# Patient Record
Sex: Male | Born: 1945 | Race: Black or African American | Hispanic: No | Marital: Married | Smoking: Former smoker
Health system: Southern US, Community
[De-identification: ages and names within clinical notes are randomized; demographics above are authoritative.]

## PROBLEM LIST (undated history)

## (undated) DIAGNOSIS — H9190 Unspecified hearing loss, unspecified ear: Secondary | ICD-10-CM

## (undated) DIAGNOSIS — I10 Essential (primary) hypertension: Secondary | ICD-10-CM

## (undated) DIAGNOSIS — J189 Pneumonia, unspecified organism: Secondary | ICD-10-CM

## (undated) DIAGNOSIS — A419 Sepsis, unspecified organism: Secondary | ICD-10-CM

## (undated) DIAGNOSIS — I428 Other cardiomyopathies: Secondary | ICD-10-CM

## (undated) DIAGNOSIS — N184 Chronic kidney disease, stage 4 (severe): Secondary | ICD-10-CM

## (undated) DIAGNOSIS — D649 Anemia, unspecified: Secondary | ICD-10-CM

## (undated) DIAGNOSIS — IMO0001 Reserved for inherently not codable concepts without codable children: Secondary | ICD-10-CM

## (undated) DIAGNOSIS — J9600 Acute respiratory failure, unspecified whether with hypoxia or hypercapnia: Secondary | ICD-10-CM

## (undated) DIAGNOSIS — N529 Male erectile dysfunction, unspecified: Secondary | ICD-10-CM

## (undated) DIAGNOSIS — E785 Hyperlipidemia, unspecified: Secondary | ICD-10-CM

## (undated) DIAGNOSIS — E119 Type 2 diabetes mellitus without complications: Secondary | ICD-10-CM

## (undated) HISTORY — DX: Anemia, unspecified: D64.9

## (undated) HISTORY — DX: Type 2 diabetes mellitus without complications: E11.9

## (undated) HISTORY — PX: AV FISTULA PLACEMENT: SHX1204

## (undated) HISTORY — DX: Reserved for inherently not codable concepts without codable children: IMO0001

## (undated) HISTORY — PX: COLONOSCOPY W/ POLYPECTOMY: SHX1380

## (undated) HISTORY — DX: Male erectile dysfunction, unspecified: N52.9

## (undated) HISTORY — DX: Unspecified hearing loss, unspecified ear: H91.90

## (undated) HISTORY — DX: Hyperlipidemia, unspecified: E78.5

## (undated) HISTORY — DX: Other cardiomyopathies: I42.8

## (undated) HISTORY — DX: Chronic kidney disease, stage 4 (severe): N18.4

## (undated) HISTORY — DX: Essential (primary) hypertension: I10

---

## 2000-04-28 DIAGNOSIS — I428 Other cardiomyopathies: Secondary | ICD-10-CM

## 2000-04-28 HISTORY — DX: Other cardiomyopathies: I42.8

## 2002-04-01 ENCOUNTER — Ambulatory Visit (HOSPITAL_COMMUNITY): Admission: RE | Admit: 2002-04-01 | Discharge: 2002-04-01 | Payer: Self-pay | Admitting: Cardiology

## 2003-05-10 ENCOUNTER — Encounter: Payer: Self-pay | Admitting: Cardiology

## 2004-03-28 ENCOUNTER — Ambulatory Visit: Payer: Self-pay | Admitting: Cardiology

## 2004-05-06 ENCOUNTER — Ambulatory Visit: Payer: Self-pay | Admitting: Cardiology

## 2005-04-10 ENCOUNTER — Ambulatory Visit: Payer: Self-pay | Admitting: Cardiology

## 2005-06-30 ENCOUNTER — Ambulatory Visit: Payer: Self-pay | Admitting: Cardiology

## 2005-07-28 ENCOUNTER — Ambulatory Visit: Payer: Self-pay | Admitting: Cardiology

## 2005-08-28 ENCOUNTER — Ambulatory Visit: Payer: Self-pay | Admitting: *Deleted

## 2005-09-17 ENCOUNTER — Ambulatory Visit: Payer: Self-pay | Admitting: *Deleted

## 2006-02-05 ENCOUNTER — Ambulatory Visit: Payer: Self-pay | Admitting: Cardiology

## 2006-05-01 ENCOUNTER — Ambulatory Visit: Payer: Self-pay | Admitting: Cardiology

## 2006-08-05 ENCOUNTER — Ambulatory Visit: Payer: Self-pay | Admitting: Cardiology

## 2007-05-07 ENCOUNTER — Ambulatory Visit (HOSPITAL_COMMUNITY): Admission: RE | Admit: 2007-05-07 | Discharge: 2007-05-07 | Payer: Self-pay | Admitting: Pulmonary Disease

## 2008-03-15 ENCOUNTER — Ambulatory Visit: Payer: Self-pay | Admitting: Cardiology

## 2008-04-07 ENCOUNTER — Ambulatory Visit: Payer: Self-pay | Admitting: Cardiology

## 2008-04-24 ENCOUNTER — Ambulatory Visit: Payer: Self-pay | Admitting: Cardiology

## 2008-05-08 ENCOUNTER — Ambulatory Visit (HOSPITAL_COMMUNITY): Admission: RE | Admit: 2008-05-08 | Discharge: 2008-05-08 | Payer: Self-pay | Admitting: Pulmonary Disease

## 2008-05-10 ENCOUNTER — Ambulatory Visit: Payer: Self-pay | Admitting: Cardiology

## 2008-05-17 ENCOUNTER — Encounter (HOSPITAL_COMMUNITY): Admission: RE | Admit: 2008-05-17 | Discharge: 2008-06-16 | Payer: Self-pay | Admitting: Pulmonary Disease

## 2008-10-02 ENCOUNTER — Telehealth (INDEPENDENT_AMBULATORY_CARE_PROVIDER_SITE_OTHER): Payer: Self-pay

## 2009-01-04 ENCOUNTER — Encounter (INDEPENDENT_AMBULATORY_CARE_PROVIDER_SITE_OTHER): Payer: Self-pay | Admitting: *Deleted

## 2009-01-04 ENCOUNTER — Encounter: Payer: Self-pay | Admitting: Cardiology

## 2009-01-04 LAB — CONVERTED CEMR LAB
Albumin: 4 g/dL
Alkaline Phosphatase: 55 units/L
Bilirubin, Direct: 0.1 mg/dL
Cholesterol: 266 mg/dL
HDL: 34 mg/dL
Total Protein: 7 g/dL
Triglycerides: 250 mg/dL

## 2009-01-08 LAB — CONVERTED CEMR LAB
ALT: 15 units/L (ref 0–53)
Bilirubin, Direct: 0.1 mg/dL (ref 0.0–0.3)
Cholesterol: 266 mg/dL — ABNORMAL HIGH (ref 0–200)
Indirect Bilirubin: 0.4 mg/dL (ref 0.0–0.9)
LDL Cholesterol: 182 mg/dL — ABNORMAL HIGH (ref 0–99)
Total CHOL/HDL Ratio: 7.8
VLDL: 50 mg/dL — ABNORMAL HIGH (ref 0–40)

## 2009-02-16 ENCOUNTER — Ambulatory Visit: Payer: Self-pay | Admitting: Cardiology

## 2009-02-16 ENCOUNTER — Encounter (INDEPENDENT_AMBULATORY_CARE_PROVIDER_SITE_OTHER): Payer: Self-pay | Admitting: *Deleted

## 2009-02-28 ENCOUNTER — Ambulatory Visit (HOSPITAL_COMMUNITY): Admission: RE | Admit: 2009-02-28 | Discharge: 2009-02-28 | Payer: Self-pay | Admitting: Pulmonary Disease

## 2009-03-27 ENCOUNTER — Encounter: Payer: Self-pay | Admitting: Orthopedic Surgery

## 2009-05-04 ENCOUNTER — Telehealth (INDEPENDENT_AMBULATORY_CARE_PROVIDER_SITE_OTHER): Payer: Self-pay | Admitting: *Deleted

## 2010-03-05 ENCOUNTER — Ambulatory Visit: Payer: Self-pay | Admitting: Cardiology

## 2010-05-28 NOTE — Assessment & Plan Note (Addendum)
Summary: 1 YR F/U PER CHECKOUT ON 02/16/09/TG  Medications Added LOPRESSOR 50 MG TABS (METOPROLOL TARTRATE) take 2 tab two times a day CRESTOR 20 MG TABS (ROSUVASTATIN CALCIUM) Take 1 tablet by mouth once a day FUROSEMIDE 40 MG TABS (FUROSEMIDE) take 1 tab two times a day ASPIR-LOW 81 MG TBEC (ASPIRIN) take 1 tab daily DIOVAN 320 MG TABS (VALSARTAN) take 1 tab daily      Allergies Added: NKDA  Visit Type:  Follow-up Referring Provider:  [facility] Primary Provider:  Dr. Shaune Pollack   History of Present Illness: [name] returns to the office for continued assessment and treatment of a remote history of cardiomyopathy, but without clinical manifestations for at least the last 8 years.  He remains active with generally good control of diabetes and hypertension.  Blood pressures measured at home range from 130 to 140 systolic with normal diastolics.  I started clonidine the last time he was in the office, but that prescription was lost somewhere along the way.  The most recent lipid profile I have available for him was from 2010 and was not at all good.  He has moderate chronic kidney disease.  He describes been referred to a program at the [facility], possibly a clinical study, but apparently did not qualify for the program.  Current Medications (verified): 1)  Glipizide 5 Mg Tabs (Glipizide) .... Take 2 Tabs Two Times A Day 2)  Metformin Hcl 500 Mg Tabs (Metformin Hcl) .... Take 1 Tab Two Times A Day 3)  Lopressor 50 Mg Tabs (Metoprolol Tartrate) .... Take 2 Tab Two Times A Day 4)  Amlodipine Besylate 10 Mg Tabs (Amlodipine Besylate) .... Take 1 Tab Daily 5)  Crestor 20 Mg Tabs (Rosuvastatin Calcium) .... Take 1 Tablet By Mouth Once A Day 6)  Furosemide 40 Mg Tabs (Furosemide) .... Take 1 Tab Two Times A Day 7)  Aspir-Low 81 Mg Tbec (Aspirin) .... Take 1 Tab Daily 8)  Diovan 320 Mg Tabs (Valsartan) .... 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  Past History:  PMH, FH, and Social History reviewed and updated.  Past Surgical History: Colonoscopy-?year  Family History: Father died as the result of trauma Mother is alive without notable chronic diseases Siblings: One sister deceased as a result of a gunshot wound; 3 sisters and one brother are living  Social History: Jehovah's Witness Disabled Married with 2 sons, one deceased  Review of Systems       See history of present illness.  Vital Signs:  Patient profile:   65 year old male Weight:      226 pounds BMI:     36.61 Pulse rate:   67 / minute BP sitting:   144 / 74  (right arm)  Vitals Entered By: Dreama Saa, CNA (March 05, 2010 2:05 PM)  Physical Exam  General:  Overweight; well developed; no acute distress;    Weight-226, 3 pounds less than last year Neck-No JVD; no carotid bruits: Lungs-No tachypnea, no rales; no rhonchi; no wheezes: Cardiovascular-normal PMI; normal S1 and S2; grade 1-2/6systolic ejection murmur heard widely across the precordium Abdomen-BS normal; soft and non-tender without masses or organomegaly:  Musculoskeletal-No deformities, no cyanosis or clubbing: Neurologic-Normal cranial nerves; symmetric strength and tone:  Skin-Warm, multiple small keloids over the neck and shoulders Extremities-1-2+ distal pulses; no edema:     Impression & Recommendations:  Problem # 1:  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  Certainly, he has no symptoms of congestive heart failure and is doing extremely well with current therapy, which is primarily directed at hypertension.  Problem # 2:  HYPERTENSION (ICD-401.1) Blood pressure control is very good, but not quite optimal.  Unfortunately,  his current medications are at maximal dose.  For a few millimeters of better blood pressure control, I am not inclined to add yet another drug.  Problem # 3:  HYPERLIPIDEMIA (ICD-272.4) Lipid control has never been adequate.  His dose of rosuvastatin will be doubled, and a profile repeated in one month.  CHOL: 266 (01/04/2009)   LDL: 182 (01/04/2009)   HDL: 34 (01/04/2009)   TG: 250 (01/04/2009)  Problem # 4:  DIABETES MELLITUS, TYPE II (ICD-250.00) Patient would benefit from weight loss in terms of control of diabetes and hypertension.  We will refer him to a dietitian for appropriate teaching if this has not previously been provided.  He has been walking up to 3 miles per day and is encouraged to continue to do this.  I will see him again in one year.  Patient Instructions: 1)  Your physician recommends that you schedule a follow-up appointment in: 1 year 2)  Your physician recommends that you return for lab work in: 1 month 3)  Your physician has recommended you make the following change in your medication:  increase crestor to 20mg   daily 4)  Your physician encouraged you to lose weight for better health. 5)  please call Victorino Dike  [phone] Prescriptions: CRESTOR 20 MG TABS (ROSUVASTATIN CALCIUM) Take 1 tablet by mouth once a day  #30 x 3   Entered by:   Teressa Lower RN   Authorized by:   Kathlen Brunswick, MD, [facility]   Signed by:   Teressa Lower RN on 03/05/2010   Method used:   Electronically to        [facility] (retail)       [address]       [place], [place]  [address]       Ph: [phone] or [phone]       Fax: [phone]   RxID:   [id number]   Appended Document: 1 YR F/U PER CHECKOUT ON 02/16/09/TG    Clinical Lists Changes  Medications: Rx of CRESTOR 20 MG TABS (ROSUVASTATIN CALCIUM) Take 1 tablet by mouth once a day;  #90 x 3;  Signed;  Entered by: Teressa Lower RN;  Authorized by: Kathlen Brunswick, MD, [facility];  Method  used: Print then Give to Patient    Prescriptions: CRESTOR 20 MG TABS (ROSUVASTATIN CALCIUM) Take 1 tablet by mouth once a day  #90 x 3   Entered by:   Teressa Lower RN   Authorized by:   Kathlen Brunswick, MD, [facility]   Signed by:   Teressa Lower RN on 05/08/2010   Method used:   Print then Give to Patient   RxID:   [id number]

## 2010-05-28 NOTE — Progress Notes (Signed)
Summary: pt out of fluid pill   Phone Note Call from Patient Call back at Home Phone [phone]   Caller: pt Reason for Call: Refill Medication, Privacy/Consent Authorization Summary of Call: [facility] has told pt that they have called [place] twice and requested fluid pill. Patient is out of med and needs to know if he needs to take 1/2 pill or a whole pill. Plus pt was seen at the [facility] and was told that something is wrong with his kidneys he wants [place] to call them and find out what needs done.  pt walk in today needs to know if he should be on pills or not nothing was called in for him at pharmamcy. Initial call taken by: Faythe Ghee,  May 04, 2009 11:34 AM    Prescriptions: CHLORTHALIDONE 25 MG TABS (CHLORTHALIDONE) take 1 tab daily  #30 x 6   Entered by:   Teressa Lower RN   Authorized by:   Kathlen Brunswick, MD, [facility]   Signed by:   Teressa Lower RN on 05/07/2009   Method used:   Electronically to        [facility] (retail)       [address]       [place], [place]  [address]       Ph: [phone] or [phone]       Fax: [phone]   RxID:   [id number]

## 2010-09-10 NOTE — Assessment & Plan Note (Signed)
[facility]                       [place] CARDIOLOGY OFFICE NOTE   NAME:WHITERanvir, Renovato                         MRN:          [id number]  DATE:03/15/2008                            DOB:          [birth year]    CARDIOLOGIST:  Gerrit Friends. Dietrich Pates, MD, [facility].   PRIMARY CARE PHYSICIAN:  Edward L. Juanetta Gosling, MD.   REASON FOR VISIT:  Follow up on cardiomyopathy.   HISTORY OF PRESENT ILLNESS:  Mr. Sjogren is a 65 year old male patient  with a history of nonischemic cardiomyopathy with a previous EF of 15-  20%, improved to 50% by echocardiogram in 2003 who presents for followup  today.  We have actually not seen him in the office since April 2008.  He is overall doing well without any complaints of chest pain or  significant shortness of breath.  He describes NYHA class II to class  IIB symptoms.  He sleeps on a large pillow and has done so for many  years without change.  He denies any paroxysmal nocturnal dyspnea.  He  notes some pedal edema that is chronic without change.  Denies any  syncope.  He is getting ready to go to [place] for vacation and leaves  early next week.   MEDICATIONS:  Glipizide 10 mg b.i.d., Lisinopril 40 mg daily, Metformin  500 mg b.i.d., Valsartan 320 mg daily, Amlodipine 10 mg daily,  Metoprolol 100 mg b.i.d., Simvastatin 80 mg nightly, Chlorthalidone 25  mg half tablet daily, Tylenol p.r.n., Advil p.r.n.   PHYSICAL EXAMINATION:  GENERAL:  He is a well-nourished and well-  developed male, in no distress.  VITAL SIGNS:  Blood pressure is 150/90, pulse 76, weight 230 pounds,  this is down 2 pounds since his last visit in April 2008.  HEENT:  Normal.  NECK:  Without JVD.  CARDIAC:  Normal S1 and S2.  Regular rate and rhythm.  LUNGS:  Clear to auscultation bilaterally.  ABDOMEN:  Soft, nontender.  EXTREMITIES:  With trace edema bilaterally.  NEUROLOGIC:  He is alert and oriented x3.  Cranial nerves II through XII  grossly intact.  VASCULAR:  Dorsalis pedis and posterior tibial pulses are 2+  bilaterally.   ASSESSMENT AND PLAN:  1. Chronic systolic congestive heart failure secondary to nonischemic      cardiomyopathy with a previous ejection fraction of 15-20%,      improved to 50% by last echocardiogram done in December 2003.  The      patient is stable from a volume standpoint.  He is on an excellent      medical regimen.  He is New York Heart Association class II to      class IIB.  No further testing is warranted at this time.  Of note,      he had normal coronaries by catheterization in 1997.  2. Hypertension, this is uncontrolled.  His goal is less than 130/80      with his diabetes mellitus.  However, since he is getting ready to      go on vacation for almost 2 weeks, we will hold off many medication  adjustments.  I have asked him to return to the office when he gets      back from his vacation for a blood pressure check with one of our      nurses.  If his blood pressure is still uncontrolled, we can      consider adjusting his chlorthalidone or his metoprolol.  3. Dyslipidemia.  We will set the patient up for CMET and lipid panel      when he returns from his vacation.  4. Chronic renal insufficiency.  As noted above, we will check labs to      follow up on his renal function as well as his lipids.   DISPOSITION:  The patient will be brought back and follow up with myself  or Dr. Dietrich Pates in the next 6 months or sooner p.r.n.      Tereso Newcomer, PA-C  Electronically Signed      Jonelle Sidle, MD  Electronically Signed   SW/MedQ  DD: 03/15/2008  DT: 03/16/2008  Job #: [id number]   cc:   Ramon Dredge L. Juanetta Gosling, M.D.

## 2010-09-13 NOTE — Letter (Signed)
August 05, 2006    Ramon Dredge L. Juanetta Gosling, M.D.  [address]  [place], [place] [address]   RE:  Roger Ashley, Roger Ashley  MRN:  [id number]  /  DOB:  April 08, 1946   Dear [name],   Roger Ashley returns to the office for continuing assessment and treatment  of a history of cardiomyopathy, hypertension and cardiovascular risk  factors.  Since the last visit, he has done generally well.  He  frequently walks outside without difficulty.  He would like to start  lifting light weights.  He does note some decrease in exercise tolerance  and rare episodes of exertional dyspnea, but generally feels well.  He  has monitored blood pressure at home with generally good results.  Systolics are typically in the 130s and sometimes in the 140s;  diastolics are usually around 70.   CURRENT MEDICATIONS INCLUDE:  1. Glipizide 10 mg b.i.d.  2. Lisinopril 40 mg daily.  3. Metformin 500 mg b.i.d.  4. Valsartan 320 mg daily.  5. Amlodipine 10 mg daily.  6. Metoprolol 100 mg b.i.d.  7. Simvastatin 80 mg daily.  8. Chlorthalidone 12.5 mg daily.   ON EXAM:  A pleasant gentleman, in no acute distress.  The weight is  232, one pound more than in January.  Blood pressure 130/75, heart rate  70 and regular, respirations 16.  NECK:  No jugular venous distention, normal carotid upstrokes, without  bruits.  LUNGS:  Clear.  CARDIAC:  Normal first and second heart sounds; minimal systolic murmur.  ABDOMEN:  Soft and nontender; no bruits, no organomegaly.  EXTREMITIES:  Trace edema.   Chemistry profile from February showed normal electrolytes, a glucose of  154, a BUN of 26, and creatinine of 1.7, which was increased from 1.5  one year earlier.  Hemoglobin A1c was 7.1.   IMPRESSION:  Roger Ashley is doing generally well, with good control of  hypertension, hyperlipidemia and diabetes.  You may wish to push the  doses of his diabetic medications slightly to achieve a hemoglobin A1c  closer to 6.  He does have a minor degree of renal  insufficiency, which  will need to be followed.  Treatment with an ARB is appropriate.  Vaccinations are up to date.  I will plan to see this nice gentleman  again in eight months.  A chemistry profile will be reassessed in four  months.    Sincerely,      Gerrit Friends. Dietrich Pates, MD, [facility]  Electronically Signed    RMR/MedQ  DD: 08/05/2006  DT: 08/05/2006  Job #: [id number]

## 2010-09-13 NOTE — Procedures (Signed)
   NAMEJAYESH, MARBACH NO.:  [id number]   MEDICAL RECORD NO.:  [id number]                   PATIENT TYPE:  OUT   LOCATION:  RAD                                  FACILITY:  [facility]   PHYSICIAN:  [place] Bing, M.D. [facility]           DATE OF BIRTH:  30-Jan-1946   DATE OF PROCEDURE:  04/01/2002                  AGE:  65  DATE OF DISCHARGE:                              SEX:  M                                CARDIAC ULTRASOUND   REFERRING PHYSICIANS:  Edward L. Juanetta Gosling, M.D. and [place] Bing, M.D.   CLINICAL INFORMATION:  A 65 year old gentleman with congestive heart  failure, hypertension, and diabetes.   M-MODE:  AORTA:  2.9 (<4.0)  LEFT ATRIUM:  4.6 (<4.0)  SEPTUM:  1.2 (0.7-1.1)  POSTERIOR WALL:  1.2  (0.7-1.1)  LV-DIASTOLE:  5.2  (<5.7)  LV-SYSTOLE:  3.8  (<4.0)   FINDINGS/IMPRESSION:  1. A technically suboptimal but adequate echocardiographic study.  2. Very mild left atrial enlargement; normal right atrium and right     ventricle.  3. Mild aortic valvular sclerosis.  4. Normal tricuspid and pulmonary valves.  5. Normal mitral valve with mild annular calcification and very mild     regurgitation.  6. Normal internal dimension of the left ventricle; very mild LVH.  Normal     regional and global LV systolic function except for a very small area at     the base of the inferior wall that appears to be hypokinetic.  7. IVC is not well imaged--probably normal in diameter.  8. Comparison with a prior study of 10/97: Interval decrease in the left     atrial and left ventricular size.  There has been a marked improvement in     LV systolic function.                                               [place] Bing, M.D. [facility]    RR/MEDQ  D:  04/01/2002  T:  04/01/2002  Job:  [id number]

## 2010-09-13 NOTE — Letter (Signed)
May 01, 2006    Edward L. Juanetta Gosling, M.D.  [address]  [place], [place] [address]   RE:  [name], [name]  MRN:  [id number]  /  DOB:  20-Dec-1945   Dear [name],   Roger Ashley returns to the office for continued assessment and treatment  of hypertension.  He was followed closely by the nurses for a few  months, but has not seen [place] since May.  He has been followed at the  [facility] and has been receiving his  medications from them.  He has also monitored blood pressure with  generally good results in recent months.  Systolics are typically around  140-150 and diastolics are all below 85.   CURRENT MEDICATIONS INCLUDE:  1. Glipizide 10 mg b.i.d.  2. Lisinopril 40 mg daily.  3. Metformin 500 mg b.i.d.  4. Metoprolol 100 mg b.i.d.  5. Simvastatin 80 mg daily.  6. Amlodipine, which was substituted for felodipine, 10 mg daily.  7. Valsartan 320 mg daily.   Once again, he has lost track of his diuretic.   PHYSICAL EXAMINATION:  A very pleasant gentleman, in no acute distress.  Weight is 231, three pounds more than in May.  Blood pressure 150/60,  heart rate 75 and regular, respirations 16.  NECK:  No jugular venous distention; normal carotid upstrokes, without  bruits.  LUNGS:  Clear.  CARDIAC:  Normal first and second heart sounds.  Fourth heart sound  present.  ABDOMEN:  Soft and nontender; no organomegaly, no masses.  EXTREMITIES:  One-half-plus ankle edema.   IMPRESSION:  Roger Ashley control of hypertension is improved.  His  medical regimen is pretty good, but he would benefit by lower blood  pressure and a low-dose diuretic.  We will add chlorthalidone 12.5 mg  daily, and provided him with a 90-day supply.  A chemistry profile and  hemoglobin A1c level will be obtained in one month.  I will see this  nice gentleman again in three months.    Sincerely,      Gerrit Friends. Dietrich Pates, MD, [facility]  Electronically Signed    RMR/MedQ  DD: 05/01/2006  DT:  05/01/2006  Job #: [id number]

## 2010-10-29 ENCOUNTER — Other Ambulatory Visit: Payer: Self-pay

## 2010-10-29 MED ORDER — VALSARTAN 320 MG PO TABS: 320.0000 mg | ORAL_TABLET | Freq: Every day | ORAL | Status: DC

## 2011-04-01 ENCOUNTER — Encounter: Payer: Self-pay | Admitting: Adult Health

## 2011-04-01 ENCOUNTER — Ambulatory Visit (INDEPENDENT_AMBULATORY_CARE_PROVIDER_SITE_OTHER): Payer: Medicare Other | Admitting: Adult Health

## 2011-04-01 DIAGNOSIS — I10 Essential (primary) hypertension: Secondary | ICD-10-CM

## 2011-04-01 DIAGNOSIS — I429 Cardiomyopathy, unspecified: Secondary | ICD-10-CM

## 2011-04-01 MED ORDER — VALSARTAN-HYDROCHLOROTHIAZIDE 320-12.5 MG PO TABS: 1.0000 | ORAL_TABLET | Freq: Every day | ORAL | Status: DC

## 2011-04-01 NOTE — Assessment & Plan Note (Signed)
Blood pressure is not adequately controlled in patient with diabetes. He is also admitting to eating salty foods over that last week,during holidays. I have retaken his BP in the exam room and it is the same as triage recording. I will add HCTZ to his diovan RX.  Check a BMET in one week.  See him in one month.  He is given low sodium diet and advised to watch his intake closely. He is to increase his exercise regimen to become more active and lose wt.

## 2011-04-01 NOTE — Progress Notes (Signed)
  HPI:  Mr. Taillon is a pleasant 65 y/o patient of Dr. Dietrich Pates we are following for ongoing assessment and treatment for  Hypertension, cardiomyopathy without symptoms, with history of diabetes.  He is usually seen by the [facility] office in [place] for PCP issues. He was recently taken off of Crestor secondary to myalgia's per [facility] physician, with follow-up in Feb for reassessment of lipids and symptoms. He comes today without complaint of chest pain, DOE, or myalgia pain. He is upset about 8 lb wt gain. He has not been as active since the cold weather has started, but normally would walk about 3 miles a day without difficulties.   No Known Allergies  Current Outpatient Prescriptions  Medication Sig Dispense Refill  . amLODipine (NORVASC) 10 MG tablet Take 10 mg by mouth daily.        Marland Kitchen aspirin 81 MG tablet Take 81 mg by mouth daily.        . furosemide (LASIX) 40 MG tablet Take 40 mg by mouth 2 (two) times daily.        Marland Kitchen glipiZIDE (GLUCOTROL) 5 MG tablet Take 10 mg by mouth 2 (two) times daily before a meal.        . metFORMIN (GLUCOPHAGE) 500 MG tablet Take 500 mg by mouth 2 (two) times daily.        . metoprolol (LOPRESSOR) 50 MG tablet Take 100 mg by mouth 2 (two) times daily.        . rosuvastatin (CRESTOR) 20 MG tablet Take 20 mg by mouth daily.        . valsartan-hydrochlorothiazide (DIOVAN-HCT) 320-12.5 MG per tablet Take 1 tablet by mouth daily.  90 tablet  3    Past Medical History  Diagnosis Date  . Chronic systolic congestive heart failure   . Non-ischemic cardiomyopathy     EF 15-20%; EF recovred to 50% by Echo in 03/2002; [facility]GA at [facility] EF 53%  . Diabetes mellitus type II   . Hyperlipidemia   . Hypertension   . Chronic renal insufficiency   . Erectile dysfunction   . Hearing impairment     Past Surgical History  Procedure Date  . Colonoscopy     WUJ:WJXBJY of systems complete and found to be negative unless listed above  PHYSICAL EXAM BP 153/77  Pulse 68  Ht 5\' 6"   (1.676 m)  Wt 233 lb (105.688 kg)  BMI 37.61 kg/m2  General: Well developed, well nourished, in no acute distress Head: Eyes PERRLA, No xanthomas.   Normal cephalic and atramatic  Lungs: Clear bilaterally to auscultation and percussion. Heart: HRRR S1 S2, without MRG.  Pulses are 2+ & equal.            No carotid bruit. No JVD.  No abdominal bruits. No femoral bruits. Abdomen: Bowel sounds are positive, abdomen soft, obese, and non-tender without masses or                  Hernia's noted. Msk:  Back normal, normal gait. Normal strength and tone for age. Extremities: No clubbing, cyanosis +1 edema.  DP +1 Neuro: Alert and oriented X 3. Psych:  Good affect, responds appropriately    ASSESSMENT AND PLAN

## 2011-04-01 NOTE — Patient Instructions (Signed)
Your physician recommends that you schedule a follow-up appointment in: 6 months   Your physician recommends that you return for lab work in: 1 week   Your physician has recommended you make the following change in your medication:  STOP Diovan START Diovan/HCTZ 320/12.5 mg daily  Low Sodium Diet (information included)

## 2011-04-08 LAB — BASIC METABOLIC PANEL
BUN: 29 mg/dL — ABNORMAL HIGH (ref 6–23)
CO2: 25 mEq/L (ref 19–32)
Chloride: 107 mEq/L (ref 96–112)
Potassium: 4.4 mEq/L (ref 3.5–5.3)

## 2011-09-25 ENCOUNTER — Other Ambulatory Visit: Payer: Self-pay | Admitting: *Deleted

## 2011-09-25 DIAGNOSIS — N289 Disorder of kidney and ureter, unspecified: Secondary | ICD-10-CM

## 2011-09-25 DIAGNOSIS — E782 Mixed hyperlipidemia: Secondary | ICD-10-CM

## 2011-09-26 LAB — BASIC METABOLIC PANEL
BUN: 30 mg/dL — ABNORMAL HIGH (ref 6–23)
Chloride: 107 mEq/L (ref 96–112)
Creat: 3.48 mg/dL — ABNORMAL HIGH (ref 0.50–1.35)
Glucose, Bld: 99 mg/dL (ref 70–99)
Potassium: 4.2 mEq/L (ref 3.5–5.3)

## 2011-09-26 LAB — LIPID PANEL
LDL Cholesterol: 126 mg/dL — ABNORMAL HIGH (ref 0–99)
VLDL: 21 mg/dL (ref 0–40)

## 2011-09-28 ENCOUNTER — Encounter: Payer: Self-pay | Admitting: Cardiology

## 2011-09-28 DIAGNOSIS — E119 Type 2 diabetes mellitus without complications: Secondary | ICD-10-CM | POA: Insufficient documentation

## 2011-09-28 DIAGNOSIS — I10 Essential (primary) hypertension: Secondary | ICD-10-CM | POA: Insufficient documentation

## 2011-09-28 DIAGNOSIS — I428 Other cardiomyopathies: Secondary | ICD-10-CM | POA: Insufficient documentation

## 2011-09-28 DIAGNOSIS — E663 Overweight: Secondary | ICD-10-CM | POA: Insufficient documentation

## 2011-09-28 DIAGNOSIS — N189 Chronic kidney disease, unspecified: Secondary | ICD-10-CM | POA: Insufficient documentation

## 2011-09-28 DIAGNOSIS — E785 Hyperlipidemia, unspecified: Secondary | ICD-10-CM | POA: Insufficient documentation

## 2011-09-29 ENCOUNTER — Encounter: Payer: Self-pay | Admitting: Cardiology

## 2011-09-29 ENCOUNTER — Encounter: Payer: Self-pay | Admitting: *Deleted

## 2011-09-29 ENCOUNTER — Ambulatory Visit (INDEPENDENT_AMBULATORY_CARE_PROVIDER_SITE_OTHER): Payer: Medicare Other | Admitting: Cardiology

## 2011-09-29 VITALS — BP 160/82 | HR 80 | Ht 66.0 in | Wt 229.0 lb

## 2011-09-29 DIAGNOSIS — I10 Essential (primary) hypertension: Secondary | ICD-10-CM

## 2011-09-29 DIAGNOSIS — E119 Type 2 diabetes mellitus without complications: Secondary | ICD-10-CM

## 2011-09-29 DIAGNOSIS — E785 Hyperlipidemia, unspecified: Secondary | ICD-10-CM

## 2011-09-29 DIAGNOSIS — N189 Chronic kidney disease, unspecified: Secondary | ICD-10-CM

## 2011-09-29 MED ORDER — CLONIDINE HCL 0.1 MG PO TABS: 0.1000 mg | ORAL_TABLET | Freq: Two times a day (BID) | ORAL | Status: DC

## 2011-09-29 MED ORDER — VALSARTAN 160 MG PO TABS: 160.0000 mg | ORAL_TABLET | Freq: Every day | ORAL | Status: DC

## 2011-09-29 MED ORDER — SILDENAFIL CITRATE 50 MG PO TABS: 50.0000 mg | ORAL_TABLET | Freq: Every day | ORAL | Status: DC | PRN

## 2011-09-29 NOTE — Patient Instructions (Addendum)
Your physician recommends that you schedule a follow-up appointment in: 1 -  8 months 2 -  1 month blood pressure check  Your physician has recommended you make the following change in your medication:  1 - Decrease Diovan 160 mg daily - May [facility] 2 - START Clonidine 0.1 mg twice a day  Your physician has requested that you regularly monitor and record your blood pressure readings at home. Please use the same machine at the same time of day to check your readings and record them to bring to your follow-up visit.  Your physician recommends that you return for lab work in: 1 month

## 2011-09-29 NOTE — Progress Notes (Signed)
Patient ID: Roger Ashley, male   DOB: May 29, 1945, 66 y.o.   MRN: [id number]  HPI: Scheduled return visit for this very nice gentleman with long-standing hypertension and chronic kidney disease.  Since his last visit, he has had progression of renal insufficiency prompting modification of his medical regime.  Thiazide diuretic was discontinued and furosemide started at moderate dose.  Although Metformin was discontinued, the patient reports good control of diabetes.  He is unaware of any recent hemoglobin A1c values.  Prior to Admission medications   Medication Sig Start Date End Date Taking? Authorizing Provider  amLODipine (NORVASC) 10 MG tablet Take 10 mg by mouth daily.     Yes Historical Provider, MD  aspirin 81 MG tablet Take 81 mg by mouth daily.     Yes Historical Provider, MD  furosemide (LASIX) 40 MG tablet Take 40 mg by mouth 2 (two) times daily.     Yes Historical Provider, MD  glipiZIDE (GLUCOTROL) 5 MG tablet Take 10 mg by mouth 2 (two) times daily before a meal.     Yes Historical Provider, MD  metoprolol (LOPRESSOR) 50 MG tablet Take 100 mg by mouth 2 (two) times daily.     Yes Historical Provider, MD  rosuvastatin (CRESTOR) 40 MG tablet Take 40 mg by mouth daily.   Yes Historical Provider, MD  valsartan (DIOVAN) 160 MG tablet Take 1 tablet (160 mg total) by mouth daily. 09/29/11  Yes Roger Brunswick, MD  cloNIDine (CATAPRES) 0.1 MG tablet Take 1 tablet (0.1 mg total) by mouth 2 (two) times daily. 09/29/11 09/28/12  Roger Brunswick, MD  sildenafil (VIAGRA) 50 MG tablet Take 1 tablet (50 mg total) by mouth daily as needed for erectile dysfunction. 09/29/11 10/29/11  Roger Brunswick, MD   No Known Allergies    Past medical history, social history, and family history reviewed and updated.  ROS: Denies dyspnea, orthopnea, PND, pedal edema, chest discomfort, palpitations, lightheadedness or syncope.  Appetite in exercise tolerance are good.  All other systems reviewed and are  negative.  PHYSICAL EXAM: BP 160/82  Pulse 80  Ht 5\' 6"  (1.676 m)  Wt 103.874 kg (229 lb)  BMI 36.96 kg/m2  General-Well developed; no acute distress Body habitus-Moderately overweight Neck-No JVD; no carotid bruits Lungs-Few rales; decreased breath sounds; resonant to percussion Cardiovascular-normal PMI; normal S1 and slightly accentuated S2; regular rhythm Abdomen-normal bowel sounds; soft and non-tender without masses or organomegaly Musculoskeletal-No deformities, no cyanosis or clubbing Neurologic-Normal cranial nerves; symmetric strength and tone Skin-Warm, no significant lesions Extremities-distal pulses intact; no edema  ASSESSMENT AND PLAN:  Roger Bing, MD 09/29/2011 3:42 PM

## 2011-09-29 NOTE — Assessment & Plan Note (Addendum)
Renal dysfunction has been progressive with an increase in creatinine from 3.15 to 3.48 over the past 6 months.  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  Dose of valsartan will be decreased to determine if this has a beneficial effect on creatinine level.  He was provided with instructions regarding a renal diet at the [place], but has not been compliant.  The importance of eating properly was stressed to him.

## 2011-09-29 NOTE — Assessment & Plan Note (Signed)
Blood pressure control is improved, but remains suboptimal.  Clonidine 0.1 mg twice daily will be added to his regime.  He will monitor blood pressure at home and return in one month for blood pressure check by the cardiology nurses as well as verification of the accuracy of his home sphygmomanometer.  If necessary, we will determine whether transdermal clonidine will be available to him from the [place].

## 2011-09-29 NOTE — Assessment & Plan Note (Signed)
CBGs have been excellent; A1c level will be obtained.

## 2011-09-29 NOTE — Assessment & Plan Note (Signed)
Somewhat suboptimal lipid profile with LDL of 126 on maximal dose of statin.  Since he has no manifest vascular disease, current therapy will be continued for now.

## 2011-09-29 NOTE — Progress Notes (Deleted)
Name: Roger Ashley    DOB: June 02, 1945  Age: 66 y.o.  MR#: [id number]       PCP:  Fredirick Maudlin, MD, MD      Insurance: @PAYORNAME @   CC:    Chief Complaint  Patient presents with  . c/o leg and neck pain    6 month f/u - Hypertensive - Med bottles reviewed.  Changes made per [facility]    VS BP 160/82  Pulse 80  Ht 5\' 6"  (1.676 m)  Wt 229 lb (103.874 kg)  BMI 36.96 kg/m2  Weights Current Weight  09/29/11 229 lb (103.874 kg)  04/01/11 233 lb (105.688 kg)  03/05/10 226 lb (102.513 kg)    Blood Pressure  BP Readings from Last 3 Encounters:  09/29/11 160/82  04/01/11 153/77  03/05/10 144/74     Admit date:  (Not on file) Last encounter with RMR:  09/28/2011   Allergy No Known Allergies  Current Outpatient Prescriptions  Medication Sig Dispense Refill  . amLODipine (NORVASC) 10 MG tablet Take 10 mg by mouth daily.        Marland Kitchen aspirin 81 MG tablet Take 81 mg by mouth daily.        . furosemide (LASIX) 40 MG tablet Take 40 mg by mouth 2 (two) times daily.        Marland Kitchen glipiZIDE (GLUCOTROL) 5 MG tablet Take 10 mg by mouth 2 (two) times daily before a meal.        . metoprolol (LOPRESSOR) 50 MG tablet Take 100 mg by mouth 2 (two) times daily.        . rosuvastatin (CRESTOR) 40 MG tablet Take 40 mg by mouth daily.      . valsartan (DIOVAN) 320 MG tablet Take 320 mg by mouth daily.        Discontinued Meds:    Medications Discontinued During This Encounter  Medication Reason  . valsartan-hydrochlorothiazide (DIOVAN-HCT) 320-12.5 MG per tablet Discontinued by provider  . metFORMIN (GLUCOPHAGE) 500 MG tablet Discontinued by provider  . rosuvastatin (CRESTOR) 20 MG tablet Discontinued by provider    Patient Active Problem List  Diagnoses  . Overweight  . Non-ischemic cardiomyopathy  . Diabetes mellitus type II  . Hyperlipidemia  . Hypertension  . Chronic renal insufficiency    LABS Orders Only on 09/25/2011  Component Date Value  . Sodium 09/25/2011 143   . Potassium  09/25/2011 4.2   . Chloride 09/25/2011 107   . CO2 09/25/2011 26   . Glucose, Bld 09/25/2011 99   . BUN 09/25/2011 30*  . Creat 09/25/2011 3.48*  . Calcium 09/25/2011 9.0   . Cholesterol 09/25/2011 181   . Triglycerides 09/25/2011 104   . HDL 09/25/2011 34*  . Total CHOL/HDL Ratio 09/25/2011 5.3   . VLDL 09/25/2011 21   . LDL Cholesterol 09/25/2011 126*     Results for this Opt Visit:     Results for orders placed in visit on 09/25/11  BASIC METABOLIC PANEL      Component Value Range   Sodium 143  135 - 145 (mEq/L)   Potassium 4.2  3.5 - 5.3 (mEq/L)   Chloride 107  96 - 112 (mEq/L)   CO2 26  19 - 32 (mEq/L)   Glucose, Bld 99  70 - 99 (mg/dL)   BUN 30 (*) 6 - 23 (mg/dL)   Creat 4.09 (*) 8.11 - 1.35 (mg/dL)   Calcium 9.0  8.4 - 91.4 (mg/dL)  LIPID PANEL  Component Value Range   Cholesterol 181  0 - 200 (mg/dL)   Triglycerides 161  <096 (mg/dL)   HDL 34 (*) >04 (mg/dL)   Total CHOL/HDL Ratio 5.3     VLDL 21  0 - 40 (mg/dL)   LDL Cholesterol 540 (*) 0 - 99 (mg/dL)    EKG Orders placed in visit on 03/28/04  . CONVERTED CEMR EKG     Prior Assessment and Plan Problem List as of 09/29/2011          Cardiology Problems   Non-ischemic cardiomyopathy   Hyperlipidemia   Hypertension     Other   Overweight   Diabetes mellitus type II   Chronic renal insufficiency       Imaging: No results found.   FRS Calculation: Score not calculated. Missing: Total Cholesterol

## 2011-10-28 ENCOUNTER — Telehealth: Payer: Self-pay | Admitting: Cardiology

## 2011-10-28 NOTE — Telephone Encounter (Signed)
PT HAS STATED THAT HE CAN NOT TAKE CLONIDINE ANY MORE WILL NOT TAKE IT TONIGHT. HE STATES THAT HE IS HAVING CRAZY DREAMS AND IT IS MAKING HIM CLIMB THE WALL. HE HAS BEEN SEEING THINGS AT NIGHT.   I TOLD PATIENT WE WOULD ASK THE DOCTOR FOR RECOMMENDATION. HE HAS APP 10/29/11 FOR BP CHECK.

## 2011-10-28 NOTE — Telephone Encounter (Signed)
Will address this at nurse visit and route to Dr Dietrich Pates for further recommendations

## 2011-10-29 ENCOUNTER — Ambulatory Visit (INDEPENDENT_AMBULATORY_CARE_PROVIDER_SITE_OTHER): Payer: Medicare Other

## 2011-10-29 VITALS — BP 159/87 | HR 69 | Ht 66.0 in | Wt 224.0 lb

## 2011-10-29 DIAGNOSIS — I10 Essential (primary) hypertension: Secondary | ICD-10-CM

## 2011-10-29 NOTE — Progress Notes (Signed)
**Note De-Identified Brookley Spitler Obfuscation** S: Pt. Arrives in office for a 1 month BP check. B: On last OV with Dr. Dietrich Pates on 6-3 pt. Was advised to decrease Diovan to 160 mg daily and to start taking Clonidine 0.1 mg bid due to sub optimal BP.  A: Pt. C/o nightmares and hallucinations since starting Clonidine 0.1 mg bid. He states he did not know which of his medicines was causing nightmares so he stopped taking both Clonidine and Diovan on Monday (July 1), he states he has been sleeping better since stopping meds. His BP this morning is 159/87 and at last OV his BP was 160/82. He did bring in his medication bottles and BP diary (copy pinned to board at Dr. Marvel Plan desk at nursing station). All BP recordings better than this mornings(all were obtained while pt. ws taking  Clonidine  And Diovan). R: Pt. Advised to resume Diovan 160 mg daily and to hold Clonidine. BP check has been scheduled for 7-17./LV   Cardiology Attending Agree with the excellent note above.  Discontinue clonidine.  Hytrin 2 milligrams q.h.s x3 days.  Advise patient to be very careful if he arises from bed before morning.  Then 5 mg q.h.s x2 weeks, then 10 mg per day.  Continue home blood pressure measurements and return in one month for a blood pressure check.  Henriette Bing, MD 11/09/2011, 9:36 AM

## 2011-11-05 ENCOUNTER — Encounter: Payer: Self-pay | Admitting: Cardiology

## 2011-11-10 MED ORDER — TERAZOSIN HCL 2 MG PO CAPS: ORAL_CAPSULE | ORAL | Status: DC

## 2011-11-10 MED ORDER — TERAZOSIN HCL 10 MG PO CAPS: ORAL_CAPSULE | ORAL | Status: DC

## 2011-11-10 NOTE — Progress Notes (Signed)
LMOM./LV 

## 2011-11-12 ENCOUNTER — Encounter: Payer: Self-pay | Admitting: Cardiology

## 2011-11-12 ENCOUNTER — Ambulatory Visit (INDEPENDENT_AMBULATORY_CARE_PROVIDER_SITE_OTHER): Payer: Medicare Other

## 2011-11-12 VITALS — BP 156/78 | HR 72 | Ht 66.0 in | Wt 228.0 lb

## 2011-11-12 DIAGNOSIS — I10 Essential (primary) hypertension: Secondary | ICD-10-CM

## 2011-11-12 NOTE — Progress Notes (Signed)
**Note De-Identified Lizeth Bencosme Obfuscation** S: Pt. Arrives in office for a f/u BP check. B: Pt. had a BP check with nurse on 7-3 b/c he was having nightmares and hallucinations since starting Clonidine at last OV with Dr. Dietrich Pates on 6-3. Pt. was advised to stop taking Clonidine but to continue taking Diovan which was also prescribed at last OV. Dr. Dietrich Pates agreed with plan and added Hytrin 2 mg qhs X 3 days then 5 mg qhs X 2 weeks then increase to 10 mg qhs there after.  A: Pt c/o of legs aching and hurting and has recently stopped taking Crestor, he wants to know if Dr.Rothbart wants him to try a different cholesterol medication. He states that since stopping Clonidine he has not had anymore nightmares or hallucinations and has no other cardiac complaints at this time. His BP this am is 156/78 and at last BP check on 7-3 his BP was 159/87. Pt. Just started taking Hytrin on 7-14 and did not take his BP meds until 15 mins. before BP check this am (pt. could not wait to see if BP decreased further, he stated he had another appt. this am). R: Pt. Advised to continue current medical treatment and that we will contact him with Dr. Marvel Plan recommendations concerning cholesterol med and any other recommendations, pt. Verbalized understanding./LV

## 2011-11-13 ENCOUNTER — Encounter: Payer: Self-pay | Admitting: *Deleted

## 2011-11-13 ENCOUNTER — Encounter: Payer: Self-pay | Admitting: Cardiology

## 2011-11-13 DIAGNOSIS — E785 Hyperlipidemia, unspecified: Secondary | ICD-10-CM

## 2011-11-13 DIAGNOSIS — N189 Chronic kidney disease, unspecified: Secondary | ICD-10-CM

## 2011-11-13 DIAGNOSIS — E119 Type 2 diabetes mellitus without complications: Secondary | ICD-10-CM

## 2011-11-13 DIAGNOSIS — I10 Essential (primary) hypertension: Secondary | ICD-10-CM

## 2011-11-13 NOTE — Progress Notes (Signed)
Patient ID: Roger Ashley, male   DOB: 1945/06/28, 66 y.o.   MRN: [id number]  Blood pressure log demonstrated excellent controlled hypertension while patient was taking clonidine, but treatment is currently in adequate without it.  He will continue up titration of terazosin, continue to record blood pressures at home and return in one month for reassessment by the cardiology nurses.  Discontinue rosuvastatin and start pravastatin 20 mg per day.

## 2011-11-14 ENCOUNTER — Other Ambulatory Visit: Payer: Self-pay | Admitting: *Deleted

## 2011-11-14 MED ORDER — PRAVASTATIN SODIUM 20 MG PO TABS: 20.0000 mg | ORAL_TABLET | Freq: Every evening | ORAL | Status: DC

## 2011-11-14 NOTE — Progress Notes (Signed)
Patient made aware of recommendations and chart updated to reflect change.

## 2011-11-14 NOTE — Progress Notes (Signed)
Message left for a return call

## 2011-11-21 ENCOUNTER — Telehealth: Payer: Self-pay | Admitting: Cardiology

## 2011-11-21 NOTE — Telephone Encounter (Signed)
Patient was just started on Pravastatin.  States he is starting to have leg pain. / tg

## 2011-11-21 NOTE — Telephone Encounter (Signed)
Please advise 

## 2011-11-23 NOTE — Telephone Encounter (Signed)
Decrease pravastatin to 10 mg QD.  If sxs continue, change to 10 mg QOD.

## 2011-11-24 ENCOUNTER — Other Ambulatory Visit: Payer: Self-pay | Admitting: *Deleted

## 2011-11-24 MED ORDER — PRAVASTATIN SODIUM 10 MG PO TABS: 10.0000 mg | ORAL_TABLET | Freq: Every evening | ORAL | Status: DC

## 2012-01-14 ENCOUNTER — Other Ambulatory Visit: Payer: Self-pay | Admitting: Cardiology

## 2012-01-15 ENCOUNTER — Other Ambulatory Visit: Payer: Self-pay | Admitting: Cardiology

## 2012-01-15 LAB — HEMOGLOBIN A1C: Mean Plasma Glucose: 120 mg/dL — ABNORMAL HIGH (ref <117)

## 2012-01-16 LAB — BASIC METABOLIC PANEL
Calcium: 9 mg/dL (ref 8.4–10.5)
Potassium: 4.2 mEq/L (ref 3.5–5.3)
Sodium: 142 mEq/L (ref 135–145)

## 2012-01-20 ENCOUNTER — Encounter: Payer: Self-pay | Admitting: Cardiology

## 2012-01-21 ENCOUNTER — Encounter: Payer: Self-pay | Admitting: *Deleted

## 2012-03-08 ENCOUNTER — Telehealth: Payer: Self-pay | Admitting: Cardiology

## 2012-03-08 MED ORDER — TERAZOSIN HCL 5 MG PO CAPS: 5.0000 mg | ORAL_CAPSULE | Freq: Every day | ORAL | Status: DC

## 2012-03-08 NOTE — Telephone Encounter (Signed)
Pt needs terazosin hcl 5mg  called in to [facility] pharmacy not [facility]. States he needs it ASAP because he is going out of town

## 2012-05-07 ENCOUNTER — Telehealth: Payer: Self-pay | Admitting: Cardiology

## 2012-05-07 MED ORDER — PRAVASTATIN SODIUM 10 MG PO TABS: 10.0000 mg | ORAL_TABLET | Freq: Every evening | ORAL | Status: DC

## 2012-05-07 NOTE — Telephone Encounter (Signed)
PT NEEDS PRAVASTATIN CALLED IN TO [facility]

## 2012-05-11 ENCOUNTER — Telehealth: Payer: Self-pay | Admitting: Cardiology

## 2012-05-11 NOTE — Telephone Encounter (Signed)
Patient is taking pravastatin and it cannot be filled at [facility].  Can we call in rx for Lovastatin instead?

## 2012-05-11 NOTE — Telephone Encounter (Signed)
Please Advise

## 2012-05-12 MED ORDER — LOVASTATIN 20 MG PO TABS: 20.0000 mg | ORAL_TABLET | Freq: Every day | ORAL | Status: DC

## 2012-05-12 NOTE — Telephone Encounter (Signed)
Patient notified and script updated to reflect change.

## 2012-05-12 NOTE — Telephone Encounter (Signed)
Absolutely.  Try 20 mg QD.  If he experiences myalgias or arthralgia, he can decrease to 10 mg.

## 2012-05-26 ENCOUNTER — Ambulatory Visit (INDEPENDENT_AMBULATORY_CARE_PROVIDER_SITE_OTHER): Payer: Medicare Other | Admitting: Cardiology

## 2012-05-26 ENCOUNTER — Ambulatory Visit: Payer: Medicare Other | Admitting: Cardiology

## 2012-05-26 ENCOUNTER — Encounter: Payer: Self-pay | Admitting: Cardiology

## 2012-05-26 VITALS — BP 130/76 | HR 68 | Ht 66.0 in | Wt 236.0 lb

## 2012-05-26 DIAGNOSIS — E785 Hyperlipidemia, unspecified: Secondary | ICD-10-CM

## 2012-05-26 DIAGNOSIS — I10 Essential (primary) hypertension: Secondary | ICD-10-CM

## 2012-05-26 DIAGNOSIS — N189 Chronic kidney disease, unspecified: Secondary | ICD-10-CM

## 2012-05-26 DIAGNOSIS — I428 Other cardiomyopathies: Secondary | ICD-10-CM

## 2012-05-26 LAB — BASIC METABOLIC PANEL
BUN: 49 mg/dL — ABNORMAL HIGH (ref 6–23)
Chloride: 107 mEq/L (ref 96–112)
Creat: 3.77 mg/dL — ABNORMAL HIGH (ref 0.50–1.35)
Glucose, Bld: 125 mg/dL — ABNORMAL HIGH (ref 70–99)
Potassium: 4.4 mEq/L (ref 3.5–5.3)

## 2012-05-26 LAB — LIPID PANEL
Cholesterol: 239 mg/dL — ABNORMAL HIGH (ref 0–200)
LDL Cholesterol: 161 mg/dL — ABNORMAL HIGH (ref 0–99)
VLDL: 45 mg/dL — ABNORMAL HIGH (ref 0–40)

## 2012-05-26 NOTE — Progress Notes (Deleted)
Name: Roger Ashley    DOB: 1945-06-06  Age: 67 y.o.  MR#: [id number]       PCP:  Fredirick Maudlin, MD      Insurance: @PAYORNAME @   CC:   No chief complaint on file.  MEDICATION BOTTLES  VS BP 130/76  Pulse 68  Ht 5\' 6"  (1.676 m)  Wt 236 lb (107.049 kg)  BMI 38.09 kg/m2  SpO2 97%  Weights Current Weight  05/26/12 236 lb (107.049 kg)  11/12/11 228 lb (103.42 kg)  10/29/11 224 lb (101.606 kg)    Blood Pressure  BP Readings from Last 3 Encounters:  05/26/12 130/76  11/12/11 156/78  10/29/11 159/87     Admit date:  (Not on file) Last encounter with RMR:  05/11/2012   Allergy Allergies  Allergen Reactions  . Clonidine Derivatives Other (See Comments)    Hallucinations; nightmares  . Crestor (Rosuvastatin) Other (See Comments)    myalgias    Current Outpatient Prescriptions  Medication Sig Dispense Refill  . amLODipine (NORVASC) 10 MG tablet Take 10 mg by mouth daily.        Marland Kitchen aspirin 81 MG tablet Take 81 mg by mouth daily.        . furosemide (LASIX) 40 MG tablet Take 40 mg by mouth 2 (two) times daily.        Marland Kitchen glipiZIDE (GLUCOTROL) 5 MG tablet Take 10 mg by mouth 2 (two) times daily before a meal.        . lovastatin (MEVACOR) 20 MG tablet Take 1 tablet (20 mg total) by mouth at bedtime.  30 tablet  6  . metoprolol (LOPRESSOR) 50 MG tablet Take 100 mg by mouth 2 (two) times daily.        Marland Kitchen terazosin (HYTRIN) 5 MG capsule Take 1 capsule (5 mg total) by mouth at bedtime.  30 capsule  11  . valsartan (DIOVAN) 160 MG tablet Take 1 tablet (160 mg total) by mouth daily.  30 tablet  12    Discontinued Meds:    Medications Discontinued During This Encounter  Medication Reason  . sildenafil (VIAGRA) 50 MG tablet Patient has not taken in last 30 days    Patient Active Problem List  Diagnosis  . Overweight  . Non-ischemic cardiomyopathy  . Diabetes mellitus type II  . Hyperlipidemia  . Hypertension  . Chronic renal insufficiency    LABS No visits with results  within 3 Month(s) from this visit. Latest known visit with results is:  Orders Only on 01/15/2012  Component Date Value  . Sodium 01/15/2012 142   . Potassium 01/15/2012 4.2   . Chloride 01/15/2012 107   . CO2 01/15/2012 26   . Glucose, Bld 01/15/2012 86   . BUN 01/15/2012 45*  . Creat 01/15/2012 3.54*  . Calcium 01/15/2012 9.0      Results for this Opt Visit:     Results for orders placed in visit on 01/15/12  BASIC METABOLIC PANEL      Component Value Range   Sodium 142  135 - 145 mEq/L   Potassium 4.2  3.5 - 5.3 mEq/L   Chloride 107  96 - 112 mEq/L   CO2 26  19 - 32 mEq/L   Glucose, Bld 86  70 - 99 mg/dL   BUN 45 (*) 6 - 23 mg/dL   Creat 4.09 (*) 8.11 - 1.35 mg/dL   Calcium 9.0  8.4 - 91.4 mg/dL    EKG Orders placed in visit on 03/28/04  .  CONVERTED CEMR EKG     Prior Assessment and Plan Problem List as of 05/26/2012            Cardiology Problems   Non-ischemic cardiomyopathy   Hyperlipidemia   Last Assessment & Plan Note   09/29/2011 Office Visit Signed 09/29/2011  3:49 PM by Kathlen Brunswick, MD    Somewhat suboptimal lipid profile with LDL of 126 on maximal dose of statin.  Since he has no manifest vascular disease, current therapy will be continued for now.    Hypertension   Last Assessment & Plan Note   09/29/2011 Office Visit Signed 09/29/2011  3:50 PM by Kathlen Brunswick, MD    Blood pressure control is improved, but remains suboptimal.  Clonidine 0.1 mg twice daily will be added to his regime.  He will monitor blood pressure at home and return in one month for blood pressure check by the cardiology nurses as well as verification of the accuracy of his home sphygmomanometer.  If necessary, we will determine whether transdermal clonidine will be available to him from the [place].      Other   Overweight   Diabetes mellitus type II   Last Assessment & Plan Note   09/29/2011 Office Visit Signed 09/29/2011  3:49 PM by Kathlen Brunswick, MD    CBGs have been excellent;  A1c level will be obtained.    Chronic renal insufficiency   Last Assessment & Plan Note   09/29/2011 Office Visit Addendum 10/01/2011 11:09 PM by Kathlen Brunswick, MD    Renal dysfunction has been progressive with an increase in creatinine from 3.15 to 3.48 over the past 6 months.  He is followed closely by a nephrologist at the 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  Dose of valsartan will be decreased to determine if this has a beneficial effect on creatinine level.  He was provided with instructions regarding a renal diet at the [place], but has not been compliant.  The importance of eating properly was stressed to him.        Imaging: No results found.   FRS Calculation: Score not calculated. Missing: Total Cholesterol

## 2012-05-26 NOTE — Assessment & Plan Note (Signed)
Excellent control of hypertension.  Clonidine was not tolerated due to nightmares

## 2012-05-26 NOTE — Progress Notes (Signed)
Patient ID: Roger Ashley, male   DOB: March 10, 1946, 67 y.o.   MRN: [id number]  HPI: Scheduled return visit for this nice gentleman with hypertension, chronic kidney disease and multiple cardiovascular risk factors. Since his last visit, Dr. Kristian Covey has assumed responsibility for management of patient's renal disease. He continues to see a primary care physician at the [facility] facility. He apparently suffered an adverse reaction to clonidine, but does not recall this. Nonetheless, control blood pressure is the best it has been for some time.  Prior to Admission medications   Medication Sig Start Date End Date Taking? Authorizing Provider  amLODipine (NORVASC) 10 MG tablet Take 10 mg by mouth daily.     Yes Historical Provider, MD  aspirin 81 MG tablet Take 81 mg by mouth daily.     Yes Historical Provider, MD  furosemide (LASIX) 40 MG tablet Take 40 mg by mouth 2 (two) times daily.     Yes Historical Provider, MD  glipiZIDE (GLUCOTROL) 5 MG tablet Take 10 mg by mouth 2 (two) times daily before a meal.     Yes Historical Provider, MD  lovastatin (MEVACOR) 20 MG tablet Take 1 tablet (20 mg total) by mouth at bedtime. 05/12/12  Yes Kathlen Brunswick, MD  metoprolol (LOPRESSOR) 50 MG tablet Take 100 mg by mouth 2 (two) times daily.     Yes Historical Provider, MD  terazosin (HYTRIN) 5 MG capsule Take 1 capsule (5 mg total) by mouth at bedtime. 03/08/12  Yes Kathlen Brunswick, MD  valsartan (DIOVAN) 160 MG tablet Take 1 tablet (160 mg total) by mouth daily. 09/29/11  Yes Kathlen Brunswick, MD   Allergies  Allergen Reactions  . Clonidine Derivatives Other (See Comments)    Hallucinations; nightmares  . Crestor (Rosuvastatin) Other (See Comments)    myalgias  Past medical history, social history, and family history reviewed and updated.  ROS: Denies chest pain or dyspnea. He has had some difficulty swallowing, especially pills, in recent weeks. All other systems reviewed and are negative.  PHYSICAL  EXAM: BP 130/76  Pulse 68  Ht 5\' 6"  (1.676 m)  Wt 107.049 kg (236 lb)  BMI 38.09 kg/m2  SpO2 97%  General-Well developed; no acute distress Body habitus-moderately overweight Neck-No JVD; no carotid bruits Lungs-clear lung fields; resonant to percussion; decreased breath sounds at the bases Cardiovascular-normal PMI; distant S1 and S2 Abdomen-normal bowel sounds; soft and non-tender without masses or organomegaly Musculoskeletal-No deformities, no cyanosis or clubbing Neurologic-Normal cranial nerves; symmetric strength and tone Skin-Warm, no significant lesions Extremities-1/2+ edema  ASSESSMENT AND PLAN:  [place] Bing, MD 05/26/2012 12:23 PM

## 2012-05-26 NOTE — Assessment & Plan Note (Addendum)
Pravastatin, change to lovastatin. A repeat lipid profile will be obtained.

## 2012-05-26 NOTE — Patient Instructions (Signed)
Your physician recommends that you schedule a follow-up appointment in: 6 months  Your physician recommends that you return for lab work in: Today

## 2012-05-26 NOTE — Assessment & Plan Note (Signed)
BUN and creatinine were stable when last assessed for months ago. The results of a more recent metabolic profile will be sought.

## 2012-05-28 ENCOUNTER — Encounter: Payer: Self-pay | Admitting: Cardiology

## 2012-05-28 ENCOUNTER — Encounter: Payer: Self-pay | Admitting: *Deleted

## 2012-05-28 ENCOUNTER — Telehealth: Payer: Self-pay | Admitting: *Deleted

## 2012-05-28 ENCOUNTER — Other Ambulatory Visit: Payer: Self-pay | Admitting: *Deleted

## 2012-05-28 DIAGNOSIS — D649 Anemia, unspecified: Secondary | ICD-10-CM | POA: Insufficient documentation

## 2012-05-28 DIAGNOSIS — E782 Mixed hyperlipidemia: Secondary | ICD-10-CM

## 2012-05-28 MED ORDER — PRAVASTATIN SODIUM 20 MG PO TABS: 20.0000 mg | ORAL_TABLET | Freq: Every evening | ORAL | Status: DC

## 2012-05-28 NOTE — Telephone Encounter (Signed)
Patient advised of recommendation.  

## 2012-05-28 NOTE — Telephone Encounter (Signed)
Lovastatin is fine dose of 20 mg per day.

## 2012-05-28 NOTE — Telephone Encounter (Signed)
Patient was placed on pravastatin today, due to lab results.  Patient then showed up in office with Lovastatin 20 mg and wanted to know if he could take that instead, since he had it in his possession.  Please advise.

## 2012-06-23 ENCOUNTER — Other Ambulatory Visit: Payer: Self-pay | Admitting: *Deleted

## 2012-06-23 DIAGNOSIS — E782 Mixed hyperlipidemia: Secondary | ICD-10-CM

## 2012-07-20 ENCOUNTER — Encounter: Payer: Self-pay | Admitting: *Deleted

## 2012-07-27 LAB — LIPID PANEL
HDL: 32 mg/dL — ABNORMAL LOW (ref >39)
Total CHOL/HDL Ratio: 5.3 Ratio
Triglycerides: 85 mg/dL (ref <150)

## 2012-07-30 ENCOUNTER — Encounter: Payer: Self-pay | Admitting: *Deleted

## 2012-07-30 ENCOUNTER — Encounter: Payer: Self-pay | Admitting: Cardiology

## 2012-10-21 ENCOUNTER — Encounter: Payer: Self-pay | Admitting: Cardiology

## 2012-10-21 ENCOUNTER — Other Ambulatory Visit: Payer: Self-pay

## 2012-10-21 DIAGNOSIS — Z01810 Encounter for preprocedural cardiovascular examination: Secondary | ICD-10-CM

## 2012-10-21 DIAGNOSIS — N186 End stage renal disease: Secondary | ICD-10-CM

## 2012-12-07 ENCOUNTER — Ambulatory Visit: Payer: Medicare Other | Admitting: Vascular Surgery

## 2013-01-27 ENCOUNTER — Emergency Department (HOSPITAL_COMMUNITY)
Admission: EM | Admit: 2013-01-27 | Discharge: 2013-01-27 | Disposition: A | Discharge status: Home / Self Care | Payer: Medicare Other | Attending: Emergency Medicine | Admitting: Emergency Medicine

## 2013-01-27 ENCOUNTER — Encounter (HOSPITAL_COMMUNITY): Payer: Self-pay | Admitting: *Deleted

## 2013-01-27 DIAGNOSIS — N189 Chronic kidney disease, unspecified: Secondary | ICD-10-CM | POA: Insufficient documentation

## 2013-01-27 DIAGNOSIS — E785 Hyperlipidemia, unspecified: Secondary | ICD-10-CM | POA: Insufficient documentation

## 2013-01-27 DIAGNOSIS — Z87448 Personal history of other diseases of urinary system: Secondary | ICD-10-CM | POA: Insufficient documentation

## 2013-01-27 DIAGNOSIS — E119 Type 2 diabetes mellitus without complications: Secondary | ICD-10-CM | POA: Insufficient documentation

## 2013-01-27 DIAGNOSIS — IMO0001 Reserved for inherently not codable concepts without codable children: Secondary | ICD-10-CM

## 2013-01-27 DIAGNOSIS — Z8669 Personal history of other diseases of the nervous system and sense organs: Secondary | ICD-10-CM | POA: Insufficient documentation

## 2013-01-27 DIAGNOSIS — Z7982 Long term (current) use of aspirin: Secondary | ICD-10-CM | POA: Insufficient documentation

## 2013-01-27 DIAGNOSIS — I129 Hypertensive chronic kidney disease with stage 1 through stage 4 chronic kidney disease, or unspecified chronic kidney disease: Secondary | ICD-10-CM | POA: Insufficient documentation

## 2013-01-27 DIAGNOSIS — Z79899 Other long term (current) drug therapy: Secondary | ICD-10-CM | POA: Insufficient documentation

## 2013-01-27 DIAGNOSIS — I509 Heart failure, unspecified: Secondary | ICD-10-CM | POA: Insufficient documentation

## 2013-01-27 DIAGNOSIS — IMO0002 Reserved for concepts with insufficient information to code with codable children: Secondary | ICD-10-CM | POA: Insufficient documentation

## 2013-01-27 DIAGNOSIS — Z862 Personal history of diseases of the blood and blood-forming organs and certain disorders involving the immune mechanism: Secondary | ICD-10-CM | POA: Insufficient documentation

## 2013-01-27 DIAGNOSIS — E663 Overweight: Secondary | ICD-10-CM | POA: Insufficient documentation

## 2013-01-27 DIAGNOSIS — Y849 Medical procedure, unspecified as the cause of abnormal reaction of the patient, or of later complication, without mention of misadventure at the time of the procedure: Secondary | ICD-10-CM | POA: Insufficient documentation

## 2013-01-27 LAB — COMPREHENSIVE METABOLIC PANEL
AST: 21 U/L (ref 0–37)
Albumin: 3.8 g/dL (ref 3.5–5.2)
BUN: 34 mg/dL — ABNORMAL HIGH (ref 6–23)
CO2: 25 mEq/L (ref 19–32)
Calcium: 9.5 mg/dL (ref 8.4–10.5)
Creatinine, Ser: 3.38 mg/dL — ABNORMAL HIGH (ref 0.50–1.35)
GFR calc non Af Amer: 18 mL/min — ABNORMAL LOW (ref >90)
Sodium: 142 mEq/L (ref 135–145)
Total Bilirubin: 0.3 mg/dL (ref 0.3–1.2)

## 2013-01-27 LAB — PROTIME-INR
INR: 1.05 (ref 0.00–1.49)
Prothrombin Time: 13.5 seconds (ref 11.6–15.2)

## 2013-01-27 LAB — CBC WITH DIFFERENTIAL/PLATELET
Basophils Absolute: 0 10*3/uL (ref 0.0–0.1)
Basophils Relative: 0 % (ref 0–1)
Eosinophils Relative: 0 % (ref 0–5)
HCT: 35 % — ABNORMAL LOW (ref 39.0–52.0)
Hemoglobin: 11.2 g/dL — ABNORMAL LOW (ref 13.0–17.0)
Lymphocytes Relative: 15 % (ref 12–46)
MCH: 28.1 pg (ref 26.0–34.0)
MCHC: 32 g/dL (ref 30.0–36.0)
MCV: 87.9 fL (ref 78.0–100.0)
Monocytes Absolute: 0.7 10*3/uL (ref 0.1–1.0)
Monocytes Relative: 8 % (ref 3–12)
RBC: 3.98 MIL/uL — ABNORMAL LOW (ref 4.22–5.81)
RDW: 12.6 % (ref 11.5–15.5)

## 2013-01-27 NOTE — ED Notes (Signed)
Had dialysis access placed in left arm today, bleeding from site, controlled on admission, dressing applied to area

## 2013-01-27 NOTE — ED Provider Notes (Signed)
CSN: [id number]     Arrival date & time 01/27/13  1653 History  This chart was scribed for Glynn Octave, MD by Blanchard Kelch, ED Scribe. The patient was seen in room APA01/APA01. Patient's care was started at 5:18 PM.    Chief Complaint  Patient presents with  . Post-op Problem    The history is provided by the patient. No language interpreter was used.    HPI Comments: Pritesh Sobecki is a 67 y.o. male who presents to the Emergency Department complaining of constant bleeding from an AV fistula graft  that was placed in his left arm at 8 am this morning. He was placed on anethesia for the surgery. It was placed in [place], [place], but he has not started dialysis yet. He does not know the name of the hospital or surgeon who performed the procedure. The bleeding is currently controlled. He denies any current pain to the area. He is currently taking aspirin but denies taking Coumadin or Plavix. He reports medical history of diabetes and heart problems. He denies chest pain, shortness of breath , dizziness or light-headedness.   His PCP is Dr. Juanetta Gosling.  Past Medical History  Diagnosis Date  . Non-ischemic cardiomyopathy 2002    Chronic systolic congestive heart failure; EF 15-20%; EF recovred to 50% by Echo in 03/2002; MUGA at [facility] EF 53%  . Diabetes mellitus type II     FBG: 82-99; with retinopathy and proteinuria  . Hyperlipidemia     Lipid profile in 08/2011:181, 104, 34, 126  . Hypertension     Lab 08/2011: Normal electrolytes  . Chronic renal insufficiency     BUN/creatinine: 29/3.15 in 03/2011, 30/3.48 in 08/2011  . Erectile dysfunction   . Hearing impairment   . Overweight(278.02)   . CHF (congestive heart failure)   . Anemia    Past Surgical History  Procedure Laterality Date  . Colonoscopy w/ polypectomy  ?  Year performed   No family history on file. History  Substance Use Topics  . Smoking status: Never Smoker   . Smokeless tobacco: Never Used  . Alcohol Use: No     Review of Systems A complete 10 system review of systems was obtained and all systems are negative except as noted in the HPI and PMH.    Allergies  Clonidine derivatives and Crestor  Home Medications   Current Outpatient Rx  Name  Route  Sig  Dispense  Refill  . amLODipine (NORVASC) 10 MG tablet   Oral   Take 10 mg by mouth daily.           . calcitRIOL (ROCALTROL) 0.25 MCG capsule   Oral   Take 0.25 mcg by mouth every other day. Monday, Wednesday and Friday         . cholecalciferol (VITAMIN D) 1000 UNITS tablet   Oral   Take 1,000 Units by mouth daily.         . furosemide (LASIX) 40 MG tablet   Oral   Take 40 mg by mouth 2 (two) times daily.           Marland Kitchen glipiZIDE (GLUCOTROL) 5 MG tablet   Oral   Take 10 mg by mouth 2 (two) times daily before a meal.           . metoprolol (LOPRESSOR) 50 MG tablet   Oral   Take 100 mg by mouth 2 (two) times daily.           Marland Kitchen oxyCODONE-acetaminophen (  PERCOCET/ROXICET) 5-325 MG per tablet   Oral   Take 1 tablet by mouth every 6 (six) hours as needed for pain.         . pravastatin (PRAVACHOL) 20 MG tablet   Oral   Take 20 mg by mouth daily.         Marland Kitchen terazosin (HYTRIN) 5 MG capsule   Oral   Take 1 capsule (5 mg total) by mouth at bedtime.   30 capsule   11   . valsartan (DIOVAN) 160 MG tablet   Oral   Take 1 tablet (160 mg total) by mouth daily.   30 tablet   12   . aspirin 81 MG tablet   Oral   Take 81 mg by mouth daily.            Triage Vitals: BP 156/67  Pulse 70  Temp(Src) 97.6 F (36.4 C) (Oral)  Resp 18  SpO2 100%  Physical Exam  Nursing note and vitals reviewed. Constitutional: He is oriented to person, place, and time. He appears well-developed and well-nourished.  HENT:  Head: Normocephalic and atraumatic.  Eyes: EOM are normal. Pupils are equal, round, and reactive to light.  Neck: Normal range of motion. Neck supple.  Cardiovascular: Normal rate, regular rhythm, normal  heart sounds and intact distal pulses.   Pulses:      Radial pulses are 2+ on the left side.  Pulmonary/Chest: Effort normal and breath sounds normal. No respiratory distress.  Abdominal: Bowel sounds are normal. He exhibits no distension. There is no tenderness.  Musculoskeletal: Normal range of motion. He exhibits no edema and no tenderness.  Cardinal hand movements intact.  Neurological: He is alert and oriented to person, place, and time. He has normal strength. No cranial nerve deficit or sensory deficit.  Skin: Skin is warm and dry. No rash noted.  Left wrist has a vascular access shunt overlying left radial artery. There is a palpable thrill and bruit. There is dried blood to surface without active bleeding. Dermabond in place with visible blood underneath.   Psychiatric: He has a normal mood and affect.    ED Course  Procedures (including critical care time)  DIAGNOSTIC STUDIES: Oxygen Saturation is 100% on room air, normal by my interpretation.    COORDINATION OF CARE: 5:25 PM -Will get information about surgeon in [place] from family. Will order CBC, CMP, and Protime-INR. Patient verbalizes understanding and agrees with treatment plan.  5:30- determined from family that he was seen at the [facility] in [place].   6:17 PM- Consulted with his surgeon from [place], Dr. Leticia Clas. He states the procedure done was for an AV fistula. Bleeding is controlled. No hematoma seen via ultrasound. Patient is stable for outpatient follow up.   7:25 PM- Rechecked surgical site. Bleeding is still controlled. Discussed discharge plan with patient including follow up with Dr. Leticia Clas if complications occur.  Patient verbalizes understanding and agrees with treatment plan.   Labs Review Labs Reviewed  CBC WITH DIFFERENTIAL - Abnormal; Notable for the following:    RBC 3.98 (*)    Hemoglobin 11.2 (*)    HCT 35.0 (*)    All other components within normal limits  COMPREHENSIVE METABOLIC PANEL -  Abnormal; Notable for the following:    Glucose, Bld 221 (*)    BUN 34 (*)    Creatinine, Ser 3.38 (*)    GFR calc non Af Amer 18 (*)    GFR calc Af Amer 21 (*)  All other components within normal limits  PROTIME-INR   Imaging Review No results found.  MDM   1. Postoperative bleeding from incision    Postoperative bleeding from dialysis fistula placed in [facility] today. Patient denies any anticoagulant use. Denies any focal weakness, numbness or tingling.  On exam, Dermabond is intact. There is some blood underneath the wound. No active bleeding. Palpable thrill and bruit. +2 radial pulse, cardinal hand movements are intact. No hematoma seen at bedside ultrasound  Discussed with patient surgeon at the [facility] Dr. Leticia Clas. He agrees the patient appears stable for outpatient followup. No bleeding evident in the ED. Neurovascularly intact. No hematoma.  Hemoglobin is 11.2. No comparison. Platelets normal. Patient observed in the ED with no recurrence of bleeding. He appears stable to followup with his vascular surgeon. He is advised to hold pressure if bleeding recurs and if it still is bleeding after 20 minutes to seek evaluation.  I personally performed the services described in this documentation, which was scribed in my presence. The recorded information has been reviewed and is accurate.    Glynn Octave, MD 01/27/13 Barry Brunner

## 2014-06-05 ENCOUNTER — Ambulatory Visit (HOSPITAL_COMMUNITY)
Admission: RE | Admit: 2014-06-05 | Discharge: 2014-06-05 | Disposition: A | Discharge status: Home / Self Care | Payer: Commercial Managed Care - HMO | Source: Ambulatory Visit | Attending: Pulmonary Disease | Admitting: Pulmonary Disease

## 2014-06-05 ENCOUNTER — Other Ambulatory Visit (HOSPITAL_COMMUNITY): Payer: Self-pay | Admitting: Pulmonary Disease

## 2014-06-05 DIAGNOSIS — M25571 Pain in right ankle and joints of right foot: Secondary | ICD-10-CM

## 2014-06-05 DIAGNOSIS — M25562 Pain in left knee: Secondary | ICD-10-CM | POA: Diagnosis present

## 2014-06-05 DIAGNOSIS — R937 Abnormal findings on diagnostic imaging of other parts of musculoskeletal system: Secondary | ICD-10-CM | POA: Diagnosis not present

## 2014-09-11 ENCOUNTER — Telehealth: Payer: Self-pay | Admitting: Cardiology

## 2014-09-11 ENCOUNTER — Encounter: Payer: Self-pay | Admitting: Cardiology

## 2014-09-11 ENCOUNTER — Ambulatory Visit (INDEPENDENT_AMBULATORY_CARE_PROVIDER_SITE_OTHER): Payer: Commercial Managed Care - HMO | Admitting: Cardiology

## 2014-09-11 VITALS — BP 138/74 | HR 75 | Ht 66.0 in | Wt 224.0 lb

## 2014-09-11 DIAGNOSIS — Z136 Encounter for screening for cardiovascular disorders: Secondary | ICD-10-CM

## 2014-09-11 DIAGNOSIS — N184 Chronic kidney disease, stage 4 (severe): Secondary | ICD-10-CM | POA: Diagnosis not present

## 2014-09-11 DIAGNOSIS — I429 Cardiomyopathy, unspecified: Secondary | ICD-10-CM | POA: Diagnosis not present

## 2014-09-11 DIAGNOSIS — I10 Essential (primary) hypertension: Secondary | ICD-10-CM | POA: Diagnosis not present

## 2014-09-11 NOTE — Telephone Encounter (Signed)
No auth required

## 2014-09-11 NOTE — Progress Notes (Signed)
Cardiology Office Note  Date: 09/11/2014   ID: Roger Ashley, DOB 07/26/1945, MRN [id number]  PCP: Fredirick Maudlin, MD  Primary Cardiologist: Nona Dell, MD   Chief Complaint  Patient presents with  . History of cardiomyopathy    History of Present Illness: Roger Ashley is a 69 y.o. male presenting to the office for a follow-up cardiac visit. This is our first meeting. He is a former patient of Dr. Dietrich Pates, last seen in 2014. I reviewed his history which is outlined below.  He maintains follow-up with Dr. Juanetta Gosling and Dr. Kristian Covey. He has additional follow-up through the [facility] hospital system in [place]. Just recently in fact, he underwent placement of a left arm AV fistula in anticipation of hemodialysis down the road.  He reports no chest pain symptoms, NYHA class II dyspnea with usual activities. He has had no palpitations or syncope. I reviewed his medications which are outlined below. He reports a stable antihypertensive regimen.  ECG reviewed today. He has not had a follow-up echocardiogram in quite some time.   Past Medical History  Diagnosis Date  . Nonischemic cardiomyopathy 2002    LVEF 15-20% initially; EF recovred to 50% by Echo in 03/2002; MUGA at [facility] EF 53%  . Diabetes mellitus type II   . Hyperlipidemia   . Essential hypertension   . 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 Erectile dysfunction   . Hearing impairment   . Anemia     Past Surgical History  Procedure Laterality Date  . Colonoscopy w/ polypectomy      Current Outpatient Prescriptions  Medication Sig Dispense Refill  . aspirin 81 MG tablet Take 81 mg by mouth daily.      . calcitRIOL (ROCALTROL) 0.25 MCG capsule Take 0.25 mcg by mouth every other day. Monday, Wednesday and Friday    . cyanocobalamin 100 MCG tablet Take 100 mcg by mouth daily.    . furosemide (LASIX) 40 MG tablet Take 40 mg by mouth 2 (two) times daily.      Marland Kitchen glipiZIDE (GLUCOTROL) 5 MG tablet Take  10 mg by mouth 2 (two) times daily before a meal.      . hydrALAZINE (APRESOLINE) 50 MG tablet Take 50 mg by mouth 3 (three) times daily.    . metoprolol (LOPRESSOR) 50 MG tablet Take 100 mg by mouth 2 (two) times daily.      . Multiple Vitamins-Minerals (PRESERVISION AREDS 2 PO) Take by mouth.    Marland Kitchen NIFEdipine (ADALAT CC) 90 MG 24 hr tablet Take 90 mg by mouth daily.    Marland Kitchen terazosin (HYTRIN) 5 MG capsule Take 1 capsule (5 mg total) by mouth at bedtime. 30 capsule 11   No current facility-administered medications for this visit.    Allergies:  Clonidine derivatives and Crestor   Social History: The patient  reports that he quit smoking about 32 years ago. He started smoking about 52 years ago. He has never used smokeless tobacco. He reports that he does not drink alcohol or use illicit drugs.   ROS:  Please see the history of present illness. Otherwise, complete review of systems is positive for decreased hearing, uses hearing aids.  All other systems are reviewed and negative.   Physical Exam: VS:  BP 138/74 mmHg  Pulse 75  Ht 5\' 6"  (1.676 m)  Wt 224 lb (101.606 kg)  BMI 36.17 kg/m2  SpO2 96%, BMI Body mass index is 36.17 kg/(m^2).  Wt Readings from  Last 3 Encounters:  09/11/14 224 lb (101.606 kg)  05/26/12 236 lb (107.049 kg)  11/12/11 228 lb (103.42 kg)     General: Overweight male, appears comfortable at rest. HEENT: Conjunctiva and lids normal, oropharynx clear. Neck: Supple, no elevated JVP or carotid bruits, no thyromegaly. Lungs: Clear to auscultation, nonlabored breathing at rest. Cardiac: Regular rate and rhythm, no S3 or significant systolic murmur, no pericardial rub. Abdomen: Soft, nontender, bowel sounds present, no guarding or rebound. Extremities: Trace leg edema, distal pulses 2+. Healing incision left antecubital fossa with soft bruit above. Skin: Warm and dry. Musculoskeletal: No kyphosis. Neuropsychiatric: Alert and oriented x3, affect grossly  appropriate.   ECG: ECG is ordered today and reviewed showing sinus rhythm with left anterior fascicular block, left atrial enlargement, probable LVH.  Recent Labwork:    Component Value Date/Time   CHOL 169 07/26/2012 0737   TRIG 85 07/26/2012 0737   HDL 32* 07/26/2012 0737   CHOLHDL 5.3 07/26/2012 0737   VLDL 17 07/26/2012 0737   LDLCALC 120* 07/26/2012 0737    ASSESSMENT AND PLAN:  1. History of nonischemic cardiomyopathy with subsequent improvement in LVEF to 50% as of 2003. He remains clinically stable on medical therapy, has not had a follow-up echocardiogram in several years. This will be arranged for reassessment.  2. Essential hypertension, blood pressure control is reasonable today. No changes made in current regimen.  3. CKD, stage 4, followed by Dr.Befekadu. Recent left arm AV fistula placed through the [facility] system.  4. Type 2 diabetes mellitus, followed by Dr. Juanetta Gosling.  Current medicines were reviewed at length with the patient today.   Orders Placed This Encounter  Procedures  . EKG 12-Lead  . Echocardiogram    Disposition: FU with me in 1 year.   Signed, Jonelle Sidle, MD, [facility] 09/11/2014 9:05 AM    [facility] at [facility] [address], [place], [place] [address] Phone: [phone]; Fax: [phone]

## 2014-09-11 NOTE — Telephone Encounter (Signed)
Patient schedule for ECHO on May 18th arrival time 8:15

## 2014-09-11 NOTE — Patient Instructions (Signed)
Your physician wants you to follow-up in: 1 year with Dr Diona Browner- MAY 2017 Thurnell Lose will receive a letter two months in advance. If you don't receive a letter, please call our office to schedule the follow-up appointment.   Your physician recommends that you continue on your current medications as directed. Please refer to the Current Medication list given to you today.   Your physician has requested that you have an echocardiogram. Echocardiography is a painless test that uses sound waves to create images of your heart. It provides your doctor with information about the size and shape of your heart and how well your heart's chambers and valves are working. This procedure takes approximately one hour. There are no restrictions for this procedure.     Thank you for choosing [facility] !

## 2014-09-13 ENCOUNTER — Ambulatory Visit (HOSPITAL_COMMUNITY): Payer: Commercial Managed Care - HMO

## 2014-09-14 ENCOUNTER — Other Ambulatory Visit (HOSPITAL_COMMUNITY): Payer: Self-pay | Admitting: Pulmonary Disease

## 2014-09-14 DIAGNOSIS — I739 Peripheral vascular disease, unspecified: Secondary | ICD-10-CM

## 2014-09-15 ENCOUNTER — Encounter (HOSPITAL_COMMUNITY): Payer: Self-pay | Admitting: Emergency Medicine

## 2014-09-15 ENCOUNTER — Other Ambulatory Visit: Payer: Self-pay

## 2014-09-15 ENCOUNTER — Ambulatory Visit (HOSPITAL_BASED_OUTPATIENT_CLINIC_OR_DEPARTMENT_OTHER)
Admission: RE | Admit: 2014-09-15 | Discharge: 2014-09-15 | Disposition: A | Discharge status: Home / Self Care | Payer: Commercial Managed Care - HMO | Source: Ambulatory Visit | Attending: Cardiology | Admitting: Cardiology

## 2014-09-15 ENCOUNTER — Emergency Department (HOSPITAL_COMMUNITY): Payer: Commercial Managed Care - HMO

## 2014-09-15 ENCOUNTER — Inpatient Hospital Stay (HOSPITAL_COMMUNITY)
Admission: EM | Admit: 2014-09-15 | Discharge: 2014-10-27 | DRG: 870 | Disposition: E | Payer: Commercial Managed Care - HMO | Attending: Internal Medicine | Admitting: Internal Medicine

## 2014-09-15 DIAGNOSIS — I429 Cardiomyopathy, unspecified: Secondary | ICD-10-CM | POA: Diagnosis not present

## 2014-09-15 DIAGNOSIS — E872 Acidosis, unspecified: Secondary | ICD-10-CM

## 2014-09-15 DIAGNOSIS — E870 Hyperosmolality and hypernatremia: Secondary | ICD-10-CM | POA: Diagnosis present

## 2014-09-15 DIAGNOSIS — G9340 Encephalopathy, unspecified: Secondary | ICD-10-CM | POA: Diagnosis present

## 2014-09-15 DIAGNOSIS — D509 Iron deficiency anemia, unspecified: Secondary | ICD-10-CM | POA: Diagnosis present

## 2014-09-15 DIAGNOSIS — I120 Hypertensive chronic kidney disease with stage 5 chronic kidney disease or end stage renal disease: Secondary | ICD-10-CM | POA: Diagnosis present

## 2014-09-15 DIAGNOSIS — J189 Pneumonia, unspecified organism: Secondary | ICD-10-CM | POA: Diagnosis present

## 2014-09-15 DIAGNOSIS — Z888 Allergy status to other drugs, medicaments and biological substances status: Secondary | ICD-10-CM

## 2014-09-15 DIAGNOSIS — K7201 Acute and subacute hepatic failure with coma: Secondary | ICD-10-CM | POA: Diagnosis not present

## 2014-09-15 DIAGNOSIS — E118 Type 2 diabetes mellitus with unspecified complications: Secondary | ICD-10-CM | POA: Diagnosis not present

## 2014-09-15 DIAGNOSIS — Z978 Presence of other specified devices: Secondary | ICD-10-CM

## 2014-09-15 DIAGNOSIS — T884XXA Failed or difficult intubation, initial encounter: Secondary | ICD-10-CM

## 2014-09-15 DIAGNOSIS — H9190 Unspecified hearing loss, unspecified ear: Secondary | ICD-10-CM | POA: Diagnosis present

## 2014-09-15 DIAGNOSIS — Z992 Dependence on renal dialysis: Secondary | ICD-10-CM

## 2014-09-15 DIAGNOSIS — Z833 Family history of diabetes mellitus: Secondary | ICD-10-CM | POA: Diagnosis not present

## 2014-09-15 DIAGNOSIS — I6340 Cerebral infarction due to embolism of unspecified cerebral artery: Secondary | ICD-10-CM | POA: Insufficient documentation

## 2014-09-15 DIAGNOSIS — N184 Chronic kidney disease, stage 4 (severe): Secondary | ICD-10-CM | POA: Insufficient documentation

## 2014-09-15 DIAGNOSIS — Z87891 Personal history of nicotine dependence: Secondary | ICD-10-CM | POA: Diagnosis not present

## 2014-09-15 DIAGNOSIS — N179 Acute kidney failure, unspecified: Secondary | ICD-10-CM | POA: Insufficient documentation

## 2014-09-15 DIAGNOSIS — D638 Anemia in other chronic diseases classified elsewhere: Secondary | ICD-10-CM | POA: Diagnosis present

## 2014-09-15 DIAGNOSIS — I639 Cerebral infarction, unspecified: Secondary | ICD-10-CM | POA: Insufficient documentation

## 2014-09-15 DIAGNOSIS — Z9911 Dependence on respirator [ventilator] status: Secondary | ICD-10-CM

## 2014-09-15 DIAGNOSIS — R4182 Altered mental status, unspecified: Secondary | ICD-10-CM | POA: Insufficient documentation

## 2014-09-15 DIAGNOSIS — Z66 Do not resuscitate: Secondary | ICD-10-CM

## 2014-09-15 DIAGNOSIS — J80 Acute respiratory distress syndrome: Secondary | ICD-10-CM | POA: Diagnosis present

## 2014-09-15 DIAGNOSIS — E785 Hyperlipidemia, unspecified: Secondary | ICD-10-CM | POA: Diagnosis present

## 2014-09-15 DIAGNOSIS — J9601 Acute respiratory failure with hypoxia: Secondary | ICD-10-CM

## 2014-09-15 DIAGNOSIS — N185 Chronic kidney disease, stage 5: Secondary | ICD-10-CM | POA: Diagnosis present

## 2014-09-15 DIAGNOSIS — Z9289 Personal history of other medical treatment: Secondary | ICD-10-CM

## 2014-09-15 DIAGNOSIS — E1121 Type 2 diabetes mellitus with diabetic nephropathy: Secondary | ICD-10-CM | POA: Diagnosis present

## 2014-09-15 DIAGNOSIS — I4901 Ventricular fibrillation: Secondary | ICD-10-CM | POA: Diagnosis not present

## 2014-09-15 DIAGNOSIS — Z4659 Encounter for fitting and adjustment of other gastrointestinal appliance and device: Secondary | ICD-10-CM

## 2014-09-15 DIAGNOSIS — J9600 Acute respiratory failure, unspecified whether with hypoxia or hypercapnia: Secondary | ICD-10-CM | POA: Diagnosis present

## 2014-09-15 DIAGNOSIS — N170 Acute kidney failure with tubular necrosis: Secondary | ICD-10-CM | POA: Diagnosis present

## 2014-09-15 DIAGNOSIS — J9690 Respiratory failure, unspecified, unspecified whether with hypoxia or hypercapnia: Secondary | ICD-10-CM

## 2014-09-15 DIAGNOSIS — D649 Anemia, unspecified: Secondary | ICD-10-CM | POA: Insufficient documentation

## 2014-09-15 DIAGNOSIS — N189 Chronic kidney disease, unspecified: Secondary | ICD-10-CM

## 2014-09-15 DIAGNOSIS — I10 Essential (primary) hypertension: Secondary | ICD-10-CM | POA: Diagnosis present

## 2014-09-15 DIAGNOSIS — I5031 Acute diastolic (congestive) heart failure: Secondary | ICD-10-CM | POA: Diagnosis not present

## 2014-09-15 DIAGNOSIS — N2581 Secondary hyperparathyroidism of renal origin: Secondary | ICD-10-CM | POA: Diagnosis present

## 2014-09-15 DIAGNOSIS — R4189 Other symptoms and signs involving cognitive functions and awareness: Secondary | ICD-10-CM

## 2014-09-15 DIAGNOSIS — Z95828 Presence of other vascular implants and grafts: Secondary | ICD-10-CM

## 2014-09-15 DIAGNOSIS — J810 Acute pulmonary edema: Secondary | ICD-10-CM | POA: Diagnosis not present

## 2014-09-15 DIAGNOSIS — J811 Chronic pulmonary edema: Secondary | ICD-10-CM | POA: Diagnosis present

## 2014-09-15 DIAGNOSIS — I469 Cardiac arrest, cause unspecified: Secondary | ICD-10-CM | POA: Diagnosis not present

## 2014-09-15 DIAGNOSIS — R1310 Dysphagia, unspecified: Secondary | ICD-10-CM | POA: Diagnosis present

## 2014-09-15 DIAGNOSIS — K5900 Constipation, unspecified: Secondary | ICD-10-CM | POA: Diagnosis present

## 2014-09-15 DIAGNOSIS — K859 Acute pancreatitis, unspecified: Secondary | ICD-10-CM | POA: Diagnosis not present

## 2014-09-15 DIAGNOSIS — E1122 Type 2 diabetes mellitus with diabetic chronic kidney disease: Secondary | ICD-10-CM | POA: Diagnosis present

## 2014-09-15 DIAGNOSIS — R680 Hypothermia, not associated with low environmental temperature: Secondary | ICD-10-CM | POA: Diagnosis not present

## 2014-09-15 DIAGNOSIS — R34 Anuria and oliguria: Secondary | ICD-10-CM | POA: Diagnosis not present

## 2014-09-15 DIAGNOSIS — Z7982 Long term (current) use of aspirin: Secondary | ICD-10-CM | POA: Diagnosis not present

## 2014-09-15 DIAGNOSIS — E875 Hyperkalemia: Secondary | ICD-10-CM | POA: Diagnosis present

## 2014-09-15 DIAGNOSIS — E119 Type 2 diabetes mellitus without complications: Secondary | ICD-10-CM

## 2014-09-15 DIAGNOSIS — R410 Disorientation, unspecified: Secondary | ICD-10-CM | POA: Insufficient documentation

## 2014-09-15 DIAGNOSIS — Z79899 Other long term (current) drug therapy: Secondary | ICD-10-CM

## 2014-09-15 DIAGNOSIS — R6521 Severe sepsis with septic shock: Secondary | ICD-10-CM | POA: Diagnosis present

## 2014-09-15 DIAGNOSIS — A419 Sepsis, unspecified organism: Secondary | ICD-10-CM | POA: Diagnosis present

## 2014-09-15 DIAGNOSIS — I63339 Cerebral infarction due to thrombosis of unspecified posterior cerebral artery: Secondary | ICD-10-CM | POA: Diagnosis not present

## 2014-09-15 DIAGNOSIS — G8191 Hemiplegia, unspecified affecting right dominant side: Secondary | ICD-10-CM | POA: Diagnosis not present

## 2014-09-15 DIAGNOSIS — K7200 Acute and subacute hepatic failure without coma: Secondary | ICD-10-CM

## 2014-09-15 DIAGNOSIS — I428 Other cardiomyopathies: Secondary | ICD-10-CM | POA: Insufficient documentation

## 2014-09-15 DIAGNOSIS — M7989 Other specified soft tissue disorders: Secondary | ICD-10-CM | POA: Diagnosis not present

## 2014-09-15 HISTORY — DX: Sepsis, unspecified organism: A41.9

## 2014-09-15 HISTORY — DX: Acute respiratory failure, unspecified whether with hypoxia or hypercapnia: J96.00

## 2014-09-15 HISTORY — DX: Pneumonia, unspecified organism: J18.9

## 2014-09-15 LAB — BASIC METABOLIC PANEL
Anion gap: 13 (ref 5–15)
BUN: 65 mg/dL — AB (ref 6–20)
CALCIUM: 8.7 mg/dL — AB (ref 8.9–10.3)
CHLORIDE: 111 mmol/L (ref 101–111)
CO2: 19 mmol/L — ABNORMAL LOW (ref 22–32)
CREATININE: 5.02 mg/dL — AB (ref 0.61–1.24)
GFR calc non Af Amer: 11 mL/min — ABNORMAL LOW (ref >60)
GFR, EST AFRICAN AMERICAN: 13 mL/min — AB (ref >60)
Glucose, Bld: 266 mg/dL — ABNORMAL HIGH (ref 65–99)
Potassium: 4.3 mmol/L (ref 3.5–5.1)
SODIUM: 143 mmol/L (ref 135–145)

## 2014-09-15 LAB — BLOOD GAS, ARTERIAL
ACID-BASE DEFICIT: 5.9 mmol/L — AB (ref 0.0–2.0)
Bicarbonate: 18.8 mEq/L — ABNORMAL LOW (ref 20.0–24.0)
Delivery systems: POSITIVE
Expiratory PAP: 8
FIO2: 55 %
INSPIRATORY PAP: 16
LHR: 6 {breaths}/min
O2 Saturation: 92.9 %
PATIENT TEMPERATURE: 37
PCO2 ART: 36.2 mmHg (ref 35.0–45.0)
TCO2: 17.7 mmol/L (ref 0–100)
pH, Arterial: 7.336 — ABNORMAL LOW (ref 7.350–7.450)
pO2, Arterial: 69.3 mmHg — ABNORMAL LOW (ref 80.0–100.0)

## 2014-09-15 LAB — CBC WITH DIFFERENTIAL/PLATELET
Basophils Absolute: 0 10*3/uL (ref 0.0–0.1)
Basophils Relative: 0 % (ref 0–1)
EOS ABS: 0.1 10*3/uL (ref 0.0–0.7)
Eosinophils Relative: 1 % (ref 0–5)
HEMATOCRIT: 32.9 % — AB (ref 39.0–52.0)
HEMOGLOBIN: 10.3 g/dL — AB (ref 13.0–17.0)
LYMPHS ABS: 3.3 10*3/uL (ref 0.7–4.0)
LYMPHS PCT: 23 % (ref 12–46)
MCH: 27.7 pg (ref 26.0–34.0)
MCHC: 31.3 g/dL (ref 30.0–36.0)
MCV: 88.4 fL (ref 78.0–100.0)
MONOS PCT: 5 % (ref 3–12)
Monocytes Absolute: 0.8 10*3/uL (ref 0.1–1.0)
NEUTROS PCT: 71 % (ref 43–77)
Neutro Abs: 9.9 10*3/uL — ABNORMAL HIGH (ref 1.7–7.7)
Platelets: 264 10*3/uL (ref 150–400)
RBC: 3.72 MIL/uL — ABNORMAL LOW (ref 4.22–5.81)
RDW: 13.8 % (ref 11.5–15.5)
WBC: 14 10*3/uL — AB (ref 4.0–10.5)

## 2014-09-15 LAB — STREP PNEUMONIAE URINARY ANTIGEN: STREP PNEUMO URINARY ANTIGEN: NEGATIVE

## 2014-09-15 LAB — INFLUENZA PANEL BY PCR (TYPE A & B)
H1N1FLUPCR: NOT DETECTED
Influenza A By PCR: NEGATIVE
Influenza B By PCR: NEGATIVE

## 2014-09-15 LAB — GLUCOSE, CAPILLARY
GLUCOSE-CAPILLARY: 192 mg/dL — AB (ref 65–99)
Glucose-Capillary: 257 mg/dL — ABNORMAL HIGH (ref 65–99)

## 2014-09-15 LAB — MRSA PCR SCREENING: MRSA by PCR: NEGATIVE

## 2014-09-15 LAB — TROPONIN I: Troponin I: 0.03 ng/mL (ref <0.031)

## 2014-09-15 LAB — TSH: TSH: 0.924 u[IU]/mL (ref 0.350–4.500)

## 2014-09-15 LAB — LACTIC ACID, PLASMA: Lactic Acid, Venous: 1.4 mmol/L (ref 0.5–2.0)

## 2014-09-15 MED ORDER — CALCITRIOL 0.25 MCG PO CAPS
0.2500 ug | ORAL_CAPSULE | ORAL | Status: DC
  Administered 2014-09-20: 0.25 ug via ORAL
  Filled 2014-09-15 (×7): qty 1

## 2014-09-15 MED ORDER — INSULIN ASPART 100 UNIT/ML ~~LOC~~ SOLN
0.0000 [IU] | Freq: Three times a day (TID) | SUBCUTANEOUS | Status: DC
  Administered 2014-09-15: 3 [IU] via SUBCUTANEOUS
  Administered 2014-09-16: 8 [IU] via SUBCUTANEOUS
  Administered 2014-09-16: 5 [IU] via SUBCUTANEOUS
  Administered 2014-09-16: 3 [IU] via SUBCUTANEOUS

## 2014-09-15 MED ORDER — ASPIRIN EC 81 MG PO TBEC
81.0000 mg | DELAYED_RELEASE_TABLET | Freq: Every day | ORAL | Status: DC
  Administered 2014-09-16 – 2014-09-17 (×2): 81 mg via ORAL
  Filled 2014-09-15 (×3): qty 1

## 2014-09-15 MED ORDER — IPRATROPIUM-ALBUTEROL 0.5-2.5 (3) MG/3ML IN SOLN
3.0000 mL | Freq: Once | RESPIRATORY_TRACT | Status: AC
  Administered 2014-09-15: 3 mL via RESPIRATORY_TRACT
  Filled 2014-09-15: qty 3

## 2014-09-15 MED ORDER — ONDANSETRON HCL 4 MG PO TABS: 4.0000 mg | ORAL_TABLET | Freq: Four times a day (QID) | ORAL | Status: DC | PRN

## 2014-09-15 MED ORDER — ALUM & MAG HYDROXIDE-SIMETH 200-200-20 MG/5ML PO SUSP: 30.0000 mL | Freq: Four times a day (QID) | ORAL | Status: DC | PRN

## 2014-09-15 MED ORDER — NIFEDIPINE ER OSMOTIC RELEASE 90 MG PO TB24
90.0000 mg | ORAL_TABLET | Freq: Every day | ORAL | Status: DC
  Administered 2014-09-16: 90 mg via ORAL
  Filled 2014-09-15 (×2): qty 3

## 2014-09-15 MED ORDER — IPRATROPIUM-ALBUTEROL 0.5-2.5 (3) MG/3ML IN SOLN
3.0000 mL | RESPIRATORY_TRACT | Status: DC
  Administered 2014-09-15 – 2014-09-16 (×8): 3 mL via RESPIRATORY_TRACT
  Filled 2014-09-15: qty 6
  Filled 2014-09-15 (×7): qty 3

## 2014-09-15 MED ORDER — HYDROCODONE-ACETAMINOPHEN 5-325 MG PO TABS: 1.0000 | ORAL_TABLET | ORAL | Status: DC | PRN

## 2014-09-15 MED ORDER — ONDANSETRON HCL 4 MG/2ML IJ SOLN: 4.0000 mg | Freq: Four times a day (QID) | INTRAMUSCULAR | Status: DC | PRN

## 2014-09-15 MED ORDER — METOPROLOL TARTRATE 100 MG PO TABS
100.0000 mg | ORAL_TABLET | Freq: Two times a day (BID) | ORAL | Status: DC
  Administered 2014-09-15 – 2014-09-16 (×2): 100 mg via ORAL
  Filled 2014-09-15: qty 1
  Filled 2014-09-15 (×2): qty 2

## 2014-09-15 MED ORDER — DEXTROSE 5 % IV SOLN
1.0000 g | INTRAVENOUS | Status: DC
  Administered 2014-09-15 – 2014-09-16 (×2): 1 g via INTRAVENOUS
  Filled 2014-09-15 (×4): qty 10

## 2014-09-15 MED ORDER — ACETAMINOPHEN 650 MG RE SUPP: 650.0000 mg | Freq: Four times a day (QID) | RECTAL | Status: DC | PRN

## 2014-09-15 MED ORDER — LORAZEPAM 2 MG/ML IJ SOLN
0.2500 mg | Freq: Once | INTRAMUSCULAR | Status: AC
  Administered 2014-09-15: 0.25 mg via INTRAVENOUS
  Filled 2014-09-15: qty 1

## 2014-09-15 MED ORDER — SENNOSIDES-DOCUSATE SODIUM 8.6-50 MG PO TABS
1.0000 | ORAL_TABLET | Freq: Every evening | ORAL | Status: DC | PRN
  Filled 2014-09-15: qty 1

## 2014-09-15 MED ORDER — VITAMIN B-12 100 MCG PO TABS
100.0000 ug | ORAL_TABLET | Freq: Every day | ORAL | Status: DC
  Administered 2014-09-16 – 2014-09-27 (×12): 100 ug via ORAL
  Filled 2014-09-15 (×14): qty 1

## 2014-09-15 MED ORDER — DEXTROSE 5 % IV SOLN
500.0000 mg | INTRAVENOUS | Status: DC
  Administered 2014-09-15 – 2014-09-16 (×2): 500 mg via INTRAVENOUS
  Filled 2014-09-15 (×4): qty 500

## 2014-09-15 MED ORDER — METHYLPREDNISOLONE SODIUM SUCC 40 MG IJ SOLR
40.0000 mg | Freq: Two times a day (BID) | INTRAMUSCULAR | Status: DC
  Administered 2014-09-15 – 2014-09-16 (×3): 40 mg via INTRAVENOUS
  Filled 2014-09-15 (×4): qty 1

## 2014-09-15 MED ORDER — MAGNESIUM CITRATE PO SOLN: 1.0000 | Freq: Once | ORAL | Status: AC | PRN

## 2014-09-15 MED ORDER — BISACODYL 10 MG RE SUPP: 10.0000 mg | Freq: Every day | RECTAL | Status: DC | PRN

## 2014-09-15 MED ORDER — FUROSEMIDE 10 MG/ML IJ SOLN
200.0000 mg | Freq: Two times a day (BID) | INTRAVENOUS | Status: DC
  Administered 2014-09-16 (×2): 200 mg via INTRAVENOUS
  Filled 2014-09-15 (×5): qty 20

## 2014-09-15 MED ORDER — HYDRALAZINE HCL 50 MG PO TABS
50.0000 mg | ORAL_TABLET | Freq: Three times a day (TID) | ORAL | Status: DC
  Administered 2014-09-15 – 2014-09-16 (×3): 50 mg via ORAL
  Filled 2014-09-15: qty 2
  Filled 2014-09-15: qty 1
  Filled 2014-09-15 (×3): qty 2

## 2014-09-15 MED ORDER — ALBUTEROL SULFATE (2.5 MG/3ML) 0.083% IN NEBU
2.5000 mg | INHALATION_SOLUTION | Freq: Once | RESPIRATORY_TRACT | Status: AC
  Administered 2014-09-15: 2.5 mg via RESPIRATORY_TRACT
  Filled 2014-09-15: qty 3

## 2014-09-15 MED ORDER — ACETAMINOPHEN 325 MG PO TABS: 650.0000 mg | ORAL_TABLET | Freq: Four times a day (QID) | ORAL | Status: DC | PRN

## 2014-09-15 MED ORDER — INSULIN ASPART 100 UNIT/ML ~~LOC~~ SOLN
0.0000 [IU] | Freq: Every day | SUBCUTANEOUS | Status: DC
  Administered 2014-09-15: 3 [IU] via SUBCUTANEOUS

## 2014-09-15 MED ORDER — HEPARIN SODIUM (PORCINE) 5000 UNIT/ML IJ SOLN
5000.0000 [IU] | Freq: Three times a day (TID) | INTRAMUSCULAR | Status: DC
  Administered 2014-09-15 – 2014-09-16 (×4): 5000 [IU] via SUBCUTANEOUS
  Filled 2014-09-15 (×4): qty 1

## 2014-09-15 MED ORDER — MORPHINE SULFATE 2 MG/ML IJ SOLN
1.0000 mg | INTRAMUSCULAR | Status: DC | PRN
  Administered 2014-09-16: 1 mg via INTRAVENOUS
  Filled 2014-09-15: qty 1

## 2014-09-15 MED ORDER — TRAZODONE 25 MG HALF TABLET
25.0000 mg | ORAL_TABLET | Freq: Every evening | ORAL | Status: DC | PRN
Start: 2014-09-15 — End: 2014-09-16
  Filled 2014-09-15: qty 1

## 2014-09-15 MED ORDER — FUROSEMIDE 10 MG/ML IJ SOLN
160.0000 mg | Freq: Once | INTRAVENOUS | Status: DC
  Administered 2014-09-15: 160 mg via INTRAVENOUS
  Filled 2014-09-15: qty 16

## 2014-09-15 NOTE — Consult Note (Signed)
Reason for Consult: CHF and chronic renal failure Referring Physician: Dr. Renford Dills Roger Ashley is an 69 y.o. male.  HPI: The patient has history of hypertension, nonischemic cardiomyopathy, CHF and chronic renal failure stage IV presently came with complaints of difficulty breathing, orthopnea for the last 3-4 days. According to the patient he was having some cough with sputum production. He denies any fever chills or sweating. Presently he says that he is feeling much better. Patient denies any nausea or vomiting.   Past Surgical History  Procedure Laterality Date  . Colonoscopy w/ polypectomy    . Av fistula placement      Family History  Problem Relation Age of Onset  . Diabetes Other     Social History:  reports that he quit smoking about 32 years ago. His smoking use included Cigarettes. He started smoking about 52 years ago. He has a 40 pack-year smoking history. He has never used smokeless tobacco. He reports that he does not drink alcohol or use illicit drugs.  Allergies:  Allergies  Allergen Reactions  . Clonidine Derivatives Other (See Comments)    Hallucinations; nightmares  . Crestor [Rosuvastatin] Other (See Comments)    myalgias    Medications: I have reviewed the patient's current medications.  Results for orders placed or performed during the hospital encounter of 09/23/2014 (from the past 48 hour(s))  Basic metabolic panel     Status: Abnormal   Collection Time: 09/08/2014  1:05 PM  Result Value Ref Range   Sodium 143 135 - 145 mmol/L   Potassium 4.3 3.5 - 5.1 mmol/L   Chloride 111 101 - 111 mmol/L   CO2 19 (L) 22 - 32 mmol/L   Glucose, Bld 266 (H) 65 - 99 mg/dL   BUN 65 (H) 6 - 20 mg/dL   Creatinine, Ser 5.02 (H) 0.61 - 1.24 mg/dL   Calcium 8.7 (L) 8.9 - 10.3 mg/dL   GFR calc non Af Amer 11 (L) >60 mL/min   GFR calc Af Amer 13 (L) >60 mL/min    Comment: (NOTE) The eGFR has been calculated using the CKD EPI equation. This calculation has not been validated  in all clinical situations. eGFR's persistently <60 mL/min signify possible Chronic Kidney Disease.    Anion gap 13 5 - 15  CBC with Differential     Status: Abnormal   Collection Time: 09/07/2014  1:05 PM  Result Value Ref Range   WBC 14.0 (H) 4.0 - 10.5 K/uL   RBC 3.72 (L) 4.22 - 5.81 MIL/uL   Hemoglobin 10.3 (L) 13.0 - 17.0 g/dL   HCT 32.9 (L) 39.0 - 52.0 %   MCV 88.4 78.0 - 100.0 fL   MCH 27.7 26.0 - 34.0 pg   MCHC 31.3 30.0 - 36.0 g/dL   RDW 13.8 11.5 - 15.5 %   Platelets 264 150 - 400 K/uL   Neutrophils Relative % 71 43 - 77 %   Neutro Abs 9.9 (H) 1.7 - 7.7 K/uL   Lymphocytes Relative 23 12 - 46 %   Lymphs Abs 3.3 0.7 - 4.0 K/uL   Monocytes Relative 5 3 - 12 %   Monocytes Absolute 0.8 0.1 - 1.0 K/uL   Eosinophils Relative 1 0 - 5 %   Eosinophils Absolute 0.1 0.0 - 0.7 K/uL   Basophils Relative 0 0 - 1 %   Basophils Absolute 0.0 0.0 - 0.1 K/uL  Troponin I     Status: None   Collection Time: 09/20/2014  1:05  PM  Result Value Ref Range   Troponin I 0.03 <0.031 ng/mL    Comment:        NO INDICATION OF MYOCARDIAL INJURY.   Blood gas, arterial ([facility] & [facility] ONLY)     Status: Abnormal   Collection Time: 09/01/2014  1:45 PM  Result Value Ref Range   FIO2 55.00 %   Delivery systems BILEVEL POSITIVE AIRWAY PRESSURE    Rate 6 resp/min   Inspiratory PAP 16    Expiratory PAP 8    pH, Arterial 7.336 (L) 7.350 - 7.450   pCO2 arterial 36.2 35.0 - 45.0 mmHg   pO2, Arterial 69.3 (L) 80.0 - 100.0 mmHg   Bicarbonate 18.8 (L) 20.0 - 24.0 mEq/L   TCO2 17.7 0 - 100 mmol/L   Acid-base deficit 5.9 (H) 0.0 - 2.0 mmol/L   O2 Saturation 92.9 %   Patient temperature 37.0    Collection site RIGHT RADIAL    Drawn by COLLECTED BY RT    Sample type ARTERIAL    Allens test (pass/fail) PASS PASS    Dg Chest Port 1 View  09/18/2014   CLINICAL DATA:  Progressive shortness of breath. Nonproductive cough.  EXAM: PORTABLE CHEST - 1 VIEW  COMPARISON:  None.  FINDINGS: The patient has extensive bilateral  consolidative pulmonary infiltrates. Heart size and pulmonary vascularity are normal. No effusions. No acute osseous abnormality.  IMPRESSION: Extensive bilateral pulmonary infiltrates.   Electronically Signed   By: Lorriane Shire M.D.   On: 09/11/2014 13:46    Review of Systems  Constitutional: Negative for fever and weight loss.  Respiratory: Positive for cough, sputum production, shortness of breath and wheezing.   Cardiovascular: Positive for orthopnea and leg swelling.  Gastrointestinal: Negative for nausea and vomiting.   Blood pressure 152/67, pulse 78, temperature 99.1 F (37.3 C), temperature source Oral, resp. rate 30, height $RemoveBe'5\' 6"'qsSCcwIwr$  (1.676 m), weight 99.791 kg (220 lb), SpO2 96 %. Physical Exam  Constitutional: He is oriented to person, place, and time. No distress.  Eyes: Left eye exhibits no discharge.  Neck: JVD present.  Cardiovascular: Normal rate.   Respiratory: No respiratory distress. He has wheezes. He has rales.  GI: He exhibits no distension. There is no tenderness. There is no rebound.  Musculoskeletal: He exhibits edema.  Neurological: He is alert and oriented to person, place, and time.    Assessment/Plan: Problem#1Difficulty in breathing possibly a combination of CHF and pneumonia. Patient is feeing better once she is on BiPAP. Problem#2 Hypertension Problem#3 CKD: Stage IV Patient with left upper arm fistula abouut 3 weeks and presently he doesn't have any nausea or vomiting. Problem#4 Anemia: Possibly anemia of chronic disease. However iron deficiency anemia cannot be ruled out. Problem#5 Possibly Comminity acqired Pneumona: Patient with history of cough.lukctosis/infiltrae Problem #6 history of diabetes Problem #7 nonischemic cardiomyopathy with low ejection fraction Plan: We'll start patient on Lasix 200 mg IV twice a day We'll follow his CBC and basic metabolic panel in the morning. Currently patient doesn't require dialysis however if lisinopril to  doesn't improve we'll put a femoral catheter and consider dialyzing came.   Drako Maese S 09/06/2014, 2:06 PM

## 2014-09-15 NOTE — ED Notes (Signed)
RT to bedside to apply bipap

## 2014-09-15 NOTE — Progress Notes (Signed)
Pt taken off BIPAP and placed on Nasal cannula for dinner.  RT will continue to monitor.

## 2014-09-15 NOTE — ED Notes (Signed)
Admission MD at the bedside. 

## 2014-09-15 NOTE — Progress Notes (Signed)
ANTIBIOTIC CONSULT NOTE  Pharmacy Consult for Renal Adjustment of ABX  Pt on Rocephin and Zithromax  Allergies  Allergen Reactions  . Clonidine Derivatives Other (See Comments)    Hallucinations; nightmares  . Crestor [Rosuvastatin] Other (See Comments)    myalgias   Patient Measurements: Height: 5\' 6"  (167.6 cm) Weight: 218 lb 11.1 oz (99.2 kg) IBW/kg (Calculated) : 63.8  Vital Signs: Temp: 97.3 F (36.3 C) (05/20 1558) Temp Source: Axillary (05/20 1558) BP: 167/72 mmHg (05/20 1600) Pulse Rate: 93 (05/20 1600)  Labs:  Recent Labs  Sep 23, 2014 1305  WBC 14.0*  HGB 10.3*  PLT 264  CREATININE 5.02*   Estimated Creatinine Clearance: 16 mL/min (by C-G formula based on Cr of 5.02).  No results for input(s): VANCOTROUGH, VANCOPEAK, VANCORANDOM, GENTTROUGH, GENTPEAK, GENTRANDOM, TOBRATROUGH, TOBRAPEAK, TOBRARND, AMIKACINPEAK, AMIKACINTROU, AMIKACIN in the last 72 hours.   Microbiology: No results found for this or any previous visit (from the past 720 hour(s)).  Medical History: Past Medical History  Diagnosis Date  . Nonischemic cardiomyopathy 2002    LVEF 15-20% initially; EF recovred to 50% by Echo in 03/2002; MUGA at [facility] EF 53%  . Diabetes mellitus type II   . Hyperlipidemia   . Essential hypertension   . 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 Erectile dysfunction   . Hearing impairment   . Anemia   . Acute respiratory failure   . CAP (community acquired pneumonia)   . Sepsis     2016   Anti-infectives    Start     Dose/Rate Route Frequency Ordered Stop   09-23-2014 1800  cefTRIAXone (ROCEPHIN) 1 g in dextrose 5 % 50 mL IVPB     1 g 100 mL/hr over 30 Minutes Intravenous Every 24 hours 09-23-2014 1613 09/22/14 1759   23-Sep-2014 1700  azithromycin (ZITHROMAX) 500 mg in dextrose 5 % 250 mL IVPB     500 mg 250 mL/hr over 60 Minutes Intravenous Every 24 hours 09/23/14 1613 09/22/14 1659     Assessment: 69yo male admitted with SOB and  started on Rocephin and Zithromax.  Renal adjustment is not needed for these ABX.  Plan:  Continue current Rx Monitor progress and cultures Transition to PO ABX when appropriate  Valrie Hart A, RPH 2014-09-23,4:27 PM

## 2014-09-15 NOTE — ED Provider Notes (Signed)
CSN: [id number]     Arrival date & time 09/22/2014  1258 History   First MD Initiated Contact with Patient 09/12/2014 1303     Chief Complaint  Patient presents with  . Shortness of Breath     Patient is a 69 y.o. male presenting with shortness of breath. The history is provided by the patient. No language interpreter was used.  Shortness of Breath  Mr. Andujo presents for evaluation of shortness of breath. He states that he developed shortness of breath started last night. Symptoms were gradual in onset. He has associated diaphoresis. He denies any chest pain, cough, change in peripheral edema. He saw his doctor today and received 2 shots. Symptoms are severe, constant, worsening. Level V caveat due to respiratory distress.    Past Medical History  Diagnosis Date  . Nonischemic cardiomyopathy 2002    LVEF 15-20% initially; EF recovred to 50% by Echo in 03/2002; MUGA at [facility] EF 53%  . Diabetes mellitus type II   . Hyperlipidemia   . Essential hypertension   . 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 Erectile dysfunction   . Hearing impairment   . Anemia    Past Surgical History  Procedure Laterality Date  . Colonoscopy w/ polypectomy    . Av fistula placement     Family History  Problem Relation Age of Onset  . Diabetes Other    History  Substance Use Topics  . Smoking status: Former Smoker -- 2.00 packs/day for 20 years    Types: Cigarettes    Start date: 04/28/1962    Quit date: 04/28/1982  . Smokeless tobacco: Never Used  . Alcohol Use: No    Review of Systems  Respiratory: Positive for shortness of breath.   All other systems reviewed and are negative.     Allergies  Clonidine derivatives and Crestor  Home Medications   Prior to Admission medications   Medication Sig Start Date End Date Taking? Authorizing Provider  aspirin 81 MG tablet Take 81 mg by mouth daily.      Historical Provider, MD  calcitRIOL (ROCALTROL) 0.25 MCG capsule  Take 0.25 mcg by mouth every other day. Monday, Wednesday and Friday    Historical Provider, MD  cyanocobalamin 100 MCG tablet Take 100 mcg by mouth daily.    Historical Provider, MD  furosemide (LASIX) 40 MG tablet Take 40 mg by mouth 2 (two) times daily.      Historical Provider, MD  glipiZIDE (GLUCOTROL) 5 MG tablet Take 10 mg by mouth 2 (two) times daily before a meal.      Historical Provider, MD  hydrALAZINE (APRESOLINE) 50 MG tablet Take 50 mg by mouth 3 (three) times daily.    Historical Provider, MD  metoprolol (LOPRESSOR) 50 MG tablet Take 100 mg by mouth 2 (two) times daily.      Historical Provider, MD  Multiple Vitamins-Minerals (PRESERVISION AREDS 2 PO) Take by mouth.    Historical Provider, MD  NIFEdipine (ADALAT CC) 90 MG 24 hr tablet Take 90 mg by mouth daily.    Historical Provider, MD  terazosin (HYTRIN) 5 MG capsule Take 1 capsule (5 mg total) by mouth at bedtime. 03/08/12   Kathlen Brunswick, MD   BP 189/118 mmHg  Pulse 105  Resp 26  Ht 5\' 6"  (1.676 m)  Wt 220 lb (99.791 kg)  BMI 35.53 kg/m2  SpO2 90% Physical Exam  Constitutional: He is oriented to person, place, and time.  He appears well-developed and well-nourished. He appears distressed.  HENT:  Head: Normocephalic and atraumatic.  Cardiovascular: Regular rhythm.   No murmur heard. tachycardic  Pulmonary/Chest: He is in respiratory distress.  Tachypnea, speaks in very short phrases, diffuse rales  Abdominal: Soft. There is no tenderness. There is no rebound and no guarding.  Musculoskeletal: He exhibits no tenderness.  2-3+ pitting edema.  Fistula in left [facility] with palpable thrill  Neurological: He is alert and oriented to person, place, and time.  Skin: Skin is warm. He is diaphoretic.  Psychiatric: He has a normal mood and affect. His behavior is normal.  Nursing note and vitals reviewed.   ED Course  Procedures (including critical care time)  CRITICAL CARE Performed by: Tilden Fossa   Total  critical care time: 30 minutes  Critical care time was exclusive of separately billable procedures and treating other patients.  Critical care was necessary to treat or prevent imminent or life-threatening deterioration.  Critical care was time spent personally by me on the following activities: development of treatment plan with patient and/or surrogate as well as nursing, discussions with consultants, evaluation of patient's response to treatment, examination of patient, obtaining history from patient or surrogate, ordering and performing treatments and interventions, ordering and review of laboratory studies, ordering and review of radiographic studies, pulse oximetry and re-evaluation of patient's condition.  Labs Review Labs Reviewed  BASIC METABOLIC PANEL - Abnormal; Notable for the following:    CO2 19 (*)    Glucose, Bld 266 (*)    BUN 65 (*)    Creatinine, Ser 5.02 (*)    Calcium 8.7 (*)    GFR calc non Af Amer 11 (*)    GFR calc Af Amer 13 (*)    All other components within normal limits  CBC WITH DIFFERENTIAL/PLATELET - Abnormal; Notable for the following:    WBC 14.0 (*)    RBC 3.72 (*)    Hemoglobin 10.3 (*)    HCT 32.9 (*)    Neutro Abs 9.9 (*)    All other components within normal limits  BLOOD GAS, ARTERIAL - Abnormal; Notable for the following:    pH, Arterial 7.336 (*)    pO2, Arterial 69.3 (*)    Bicarbonate 18.8 (*)    Acid-base deficit 5.9 (*)    All other components within normal limits  TROPONIN I    Imaging Review Dg Chest Port 1 View  09/02/2014   CLINICAL DATA:  Progressive shortness of breath. Nonproductive cough.  EXAM: PORTABLE CHEST - 1 VIEW  COMPARISON:  None.  FINDINGS: The patient has extensive bilateral consolidative pulmonary infiltrates. Heart size and pulmonary vascularity are normal. No effusions. No acute osseous abnormality.  IMPRESSION: Extensive bilateral pulmonary infiltrates.   Electronically Signed   By: Francene Boyers M.D.   On:  09/19/2014 13:46     EKG Interpretation   Date/Time:  Friday Sep 15 2014 13:04:21 EDT Ventricular Rate:  101 PR Interval:  194 QRS Duration: 139 QT Interval:  402 QTC Calculation: 521 R Axis:   -44 Text Interpretation:  Sinus tachycardia Probable left atrial enlargement  Left bundle branch block Confirmed by Lincoln Brigham [phone]) on 09/24/2014  1:36:09 PM      MDM   Final diagnoses:  Acute pulmonary edema  Acute on chronic kidney failure    Patient with history of stage IV kidney disease here with rapid onset shortness of breath. Patient in severe respiratory distress on ED arrival with hypoxia, accessory muscle  use. On repeat evaluation following BiPAP patient is much improved with improved work of breathing as well as oxygenation. Discussed with nephrology, recommends IV Lasix bolus and continued twice a day Lasix in the hospital. Discussed with hospitalist regarding admission. Updated patient as to care plan and need for admission for further management.    Tilden Fossa, MD 09/24/14 [phone]

## 2014-09-15 NOTE — ED Notes (Signed)
phelbotomy to bedside, and pt will be transported after labs drawn

## 2014-09-15 NOTE — ED Notes (Addendum)
Patient c/o shortness ogf breath that started last night. Audible wheezing noted. Denies any chest pain. Diaphoretic. Patient reports seeing his PCP this morning and receiving a "shot of antibiotics" but was not told what his diagnose was. Patient's O2 sat 66% on room air. EDP made aware. HX of CHF.

## 2014-09-15 NOTE — H&P (Signed)
[facility] History and Physical  Mina Metcalf HUD:[id number] DOB: 24-Apr-1946 DOA: 08/29/2014  Referring physician: Pecola Ashley in ED PCP: Roger Maudlin, MD   Chief Complaint: sob  HPI: Roger Ashley is a very pleasant 69 y.o. male with a past medical history that includes nonischemic cardiomyopathy, diabetes, hypertension, chronic kidney disease stage IV presents to the emergency department with the chief complaint shortness of breath. Initial evaluation in the emergency department reveals acute respiratory failure with hypoxia related to acute diastolic heart failure in the setting of pneumonia as well as acute on chronic renal failure.  Patient reports sudden onset of shortness of breath last evening. Associated symptoms include nausea no vomiting orthopnea, diaphoresis. He denies chest pain palpitations cough fever or chills recently traveled or sick contacts.   Workup in the emergency department reveals chest x-ray extensive bilateral pulmonary infiltrates, 2-D echo revealing ejection fraction of 40-40% grade 2 diastolic dysfunction, complete blood count significant for WBCs of 14.9 hemoglobin 10.3, basic metabolic panel significant for CO2 of 19 BUN of 65 creatinine of 5.2 serum glucose 266  Patient was in respiratory distress upon presentation immediately placed on BiPAP with good results. At the time, exam his respirations are nonlabored. He is hemodynamically stable afebrile  Review of Systems:  10 point review of systems completesystems negative except as indicated in the history of present illness  Past Medical History  Diagnosis Date  . Nonischemic cardiomyopathy 2002    LVEF 15-20% initially; EF recovred to 50% by Echo in 03/2002; MUGA at [facility] EF 53%  . Diabetes mellitus type II   . Hyperlipidemia   . Essential hypertension   . 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 Erectile dysfunction   . Hearing impairment   . Anemia   . Acute respiratory  failure   . CAP (community acquired pneumonia)   . Sepsis     2016   Past Surgical History  Procedure Laterality Date  . Colonoscopy w/ polypectomy    . Av fistula placement     Social History:  reports that he quit smoking about 32 years ago. His smoking use included Cigarettes. He started smoking about 52 years ago. He has a 40 pack-year smoking history. He has never used smokeless tobacco. He reports that he does not drink alcohol or use illicit drugs.  Allergies  Allergen Reactions  . Clonidine Derivatives Other (See Comments)    Hallucinations; nightmares  . Crestor [Rosuvastatin] Other (See Comments)    myalgias    Family History  Problem Relation Age of Onset  . Diabetes Other    family medical history reviewed and noncontributory to the admission of this gentleman  Prior to Admission medications   Medication Sig Start Date End Date Taking? Authorizing Provider  aspirin 81 MG tablet Take 81 mg by mouth daily.     Yes Historical Provider, MD  calcitRIOL (ROCALTROL) 0.25 MCG capsule Take 0.25 mcg by mouth every other day. Monday, Wednesday and Friday   Yes Historical Provider, MD  cyanocobalamin 100 MCG tablet Take 100 mcg by mouth daily.   Yes Historical Provider, MD  furosemide (LASIX) 40 MG tablet Take 40 mg by mouth 2 (two) times daily.     Yes Historical Provider, MD  glipiZIDE (GLUCOTROL) 5 MG tablet Take 10 mg by mouth 2 (two) times daily before a meal.     Yes Historical Provider, MD  hydrALAZINE (APRESOLINE) 50 MG tablet Take 50 mg by mouth 3 (three) times daily.  Yes Historical Provider, MD  metoprolol (LOPRESSOR) 50 MG tablet Take 100 mg by mouth 2 (two) times daily.     Yes Historical Provider, MD  Multiple Vitamins-Minerals (PRESERVISION AREDS 2 PO) Take by mouth.   Yes Historical Provider, MD  NIFEdipine (ADALAT CC) 90 MG 24 hr tablet Take 90 mg by mouth daily.   Yes Historical Provider, MD   Physical Exam: Filed Vitals:   09-30-14 1330 30-Sep-2014 1400  09/30/2014 1430 September 30, 2014 1500  BP: 152/67 153/78 174/88 156/67  Pulse: 78 93 89 87  Temp:      TempSrc:      Resp: Height:      Weight:      SpO2: 96% 98% 96% 99%    Wt Readings from Last 3 Encounters:  2014/09/30 99.791 kg (220 lb)  09/11/14 101.606 kg (224 lb)  05/26/12 107.049 kg (236 lb)    General:  Appears calm and comfortable on BiPAP Eyes: PERRL, normal lids, irises & conjunctiva ENT: grossly normal hearing, decreased membranes of her mouth moist pain Neck: no LAD, masses or thyromegaly Cardiovascular: RRR, no m/r/g. Trace to 1+ lower extremity edema.  Respiratory: Increased work of breathing with conversation on BiPAP, breast sounds diminished throughout and crackles noted in bilateral bases no wheeze Abdomen: soft, ntnd, obese no guarding Skin: no rash or induration seen on limited exam Musculoskeletal: grossly normal tone BUE/BLE Psychiatric: grossly normal mood and affect, speech fluent and appropriate Neurologic: grossly non-focal. Speech is clear facial symmetry           Labs on Admission:  Basic Metabolic Panel:  Recent Labs Lab September 30, 2014 1305  NA 143  K 4.3  CL 111  CO2 19*  GLUCOSE 266*  BUN 65*  CREATININE 5.02*  CALCIUM 8.7*   Liver Function Tests: No results for input(s): AST, ALT, ALKPHOS, BILITOT, PROT, ALBUMIN in the last 168 hours. No results for input(s): LIPASE, AMYLASE in the last 168 hours. No results for input(s): AMMONIA in the last 168 hours. CBC:  Recent Labs Lab 2014-09-30 1305  WBC 14.0*  NEUTROABS 9.9*  HGB 10.3*  HCT 32.9*  MCV 88.4  PLT 264   Cardiac Enzymes:  Recent Labs Lab September 30, 2014 1305  TROPONINI 0.03    BNP (last 3 results) No results for input(s): BNP in the last 8760 hours.  ProBNP (last 3 results) No results for input(s): PROBNP in the last 8760 hours.  CBG: No results for input(s): GLUCAP in the last 168 hours.  Radiological Exams on Admission: Dg Chest Port 1 View  09/30/2014    CLINICAL DATA:  Progressive shortness of breath. Nonproductive cough.  EXAM: PORTABLE CHEST - 1 VIEW  COMPARISON:  None.  FINDINGS: The patient has extensive bilateral consolidative pulmonary infiltrates. Heart size and pulmonary vascularity are normal. No effusions. No acute osseous abnormality.  IMPRESSION: Extensive bilateral pulmonary infiltrates.   Electronically Signed   By: Francene Boyers M.D.   On: Sep 30, 2014 13:46    EKG: Independently reviewed. Sinus tachycardia with left atrial enlargement and right bundle branch block   Assessment/Plan Principal Problem:   Acute respiratory failure with hypoxia: Multi-factorial specifically acute on chronic diastolic heart failure with community-acquired pneumonia. Admit to stepdown. But improved with BiPAP. We'll continue this. Wean as indicated and able. Repeat chest x-ray in the a.m. Active Problems: CAP (community acquired pneumonia): Per chest x-ray. Mild leukocytosis. Max temp 99.2. Will obtain blood cultures, sputum culture. Strep pneumonia urine antigen and legionella urine antigen.  Rocephin and azithromycin per protocol. He is hemodynamically stable. Monitor Closely    Sepsis: Patient with leukocytosis, tachypnea, tachycardia and chest x-ray concerning for pneumonia. Check lactic acid. Currently hemodynamically stable. Hold off on aggressive fluids today to acute systolic heart failure. ACE inhibitor improved at the time of my exam   Acute renal failure superimposed on stage 4 chronic kidney disease; Dr. Mariel Ashley consulted in the emergency department. Lasix orders given. Will monitor intake and output. Daily weights. Continue IV Lasix per renal. Patient reports being in discussion with nephrology about dialysis when needed  Acute diastolic heart failure in the setting of nonischemic cardiomyopathy. Today yields an ejection fraction of 40-40% and grade 2 diastolic dysfunction. Patient stage IV renal failure. Lasix per nephrology as indicated above.  We'll monitor intake and output obtain daily weights. Her medications include metoprolol which we will continue.    Diabetes mellitus, type 2: He is on oral agents at home. We'll obtain a hemoglobin A1c. I'll get a hold his avoidance for now and use sliding scale insulin. Provided carb modified diet    Hyperlipidemia: Obtain lipid panel.    Hypertension: Recent hypertensive during time in the emergency department. We'll continue hydralazine, metoprolol, nifedipine. IV Lasix as noted above.  Dr Roger Ashley  Code Status: full DVT Prophylaxis: Family Communication: wife at bedside Disposition Plan: home hopefully 48 hours  Time spent: 65 minutes  [facility] Pager [phone]

## 2014-09-15 NOTE — ED Notes (Signed)
Per dr. Kristian Covey, okay for pt to finish 160mg  bag of Lasix and then start the 200mg  bag in 12 hours. Pharmacy notified of the same

## 2014-09-16 ENCOUNTER — Inpatient Hospital Stay (HOSPITAL_COMMUNITY): Payer: Commercial Managed Care - HMO | Admitting: Anesthesiology

## 2014-09-16 ENCOUNTER — Inpatient Hospital Stay (HOSPITAL_COMMUNITY): Payer: Commercial Managed Care - HMO

## 2014-09-16 DIAGNOSIS — J80 Acute respiratory distress syndrome: Secondary | ICD-10-CM | POA: Diagnosis present

## 2014-09-16 DIAGNOSIS — J9601 Acute respiratory failure with hypoxia: Secondary | ICD-10-CM

## 2014-09-16 LAB — BLOOD GAS, ARTERIAL
ACID-BASE DEFICIT: 5.5 mmol/L — AB (ref 0.0–2.0)
Acid-base deficit: 4.9 mmol/L — ABNORMAL HIGH (ref 0.0–2.0)
BICARBONATE: 19.1 meq/L — AB (ref 20.0–24.0)
Bicarbonate: 19.3 mEq/L — ABNORMAL LOW (ref 20.0–24.0)
Delivery systems: POSITIVE
Drawn by: 22223
Drawn by: 105551
Expiratory PAP: 6
FIO2: 100 %
FIO2: 100 %
Inspiratory PAP: 14
MECHVT: 510 mL
O2 SAT: 96 %
O2 Saturation: 88.9 %
PATIENT TEMPERATURE: 37
PCO2 ART: 32.2 mmHg — AB (ref 35.0–45.0)
PEEP: 12 cmH2O
Patient temperature: 37
RATE: 14 resp/min
TCO2: 17.6 mmol/L (ref 0–100)
TCO2: 17.8 mmol/L (ref 0–100)
pCO2 arterial: 37 mmHg (ref 35.0–45.0)
pH, Arterial: 7.391 (ref 7.350–7.450)
pH, Arterial: 7.337 — ABNORMAL LOW (ref 7.350–7.450)
pO2, Arterial: 79.6 mmHg — ABNORMAL LOW (ref 80.0–100.0)
pO2, Arterial: 59.8 mmHg — ABNORMAL LOW (ref 80.0–100.0)

## 2014-09-16 LAB — GLUCOSE, CAPILLARY: Glucose-Capillary: 275 mg/dL — ABNORMAL HIGH (ref 65–99)

## 2014-09-16 LAB — CBC
HCT: 30 % — ABNORMAL LOW (ref 39.0–52.0)
Hemoglobin: 9.6 g/dL — ABNORMAL LOW (ref 13.0–17.0)
MCH: 27.9 pg (ref 26.0–34.0)
MCHC: 32 g/dL (ref 30.0–36.0)
MCV: 87.2 fL (ref 78.0–100.0)
PLATELETS: 227 10*3/uL (ref 150–400)
RBC: 3.44 MIL/uL — AB (ref 4.22–5.81)
RDW: 13.6 % (ref 11.5–15.5)
WBC: 14.3 10*3/uL — AB (ref 4.0–10.5)

## 2014-09-16 LAB — BASIC METABOLIC PANEL
Anion gap: 14 (ref 5–15)
BUN: 71 mg/dL — ABNORMAL HIGH (ref 6–20)
CO2: 17 mmol/L — ABNORMAL LOW (ref 22–32)
Calcium: 8.5 mg/dL — ABNORMAL LOW (ref 8.9–10.3)
Chloride: 111 mmol/L (ref 101–111)
Creatinine, Ser: 5.25 mg/dL — ABNORMAL HIGH (ref 0.61–1.24)
GFR calc Af Amer: 12 mL/min — ABNORMAL LOW (ref >60)
GFR calc non Af Amer: 10 mL/min — ABNORMAL LOW (ref >60)
Glucose, Bld: 207 mg/dL — ABNORMAL HIGH (ref 65–99)
POTASSIUM: 4.5 mmol/L (ref 3.5–5.1)
Sodium: 142 mmol/L (ref 135–145)

## 2014-09-16 LAB — PHOSPHORUS: PHOSPHORUS: 3.9 mg/dL (ref 2.5–4.6)

## 2014-09-16 LAB — HEMOGLOBIN A1C
Hgb A1c MFr Bld: 5.4 % (ref 4.8–5.6)
MEAN PLASMA GLUCOSE: 108 mg/dL

## 2014-09-16 MED ORDER — VANCOMYCIN HCL IN DEXTROSE 1-5 GM/200ML-% IV SOLN
1000.0000 mg | INTRAVENOUS | Status: DC
  Administered 2014-09-17: 1000 mg via INTRAVENOUS
  Filled 2014-09-16: qty 200

## 2014-09-16 MED ORDER — DEXTROSE 5 % IV SOLN
1.0000 g | INTRAVENOUS | Status: DC
  Administered 2014-09-17: 1 g via INTRAVENOUS
  Filled 2014-09-16 (×2): qty 1

## 2014-09-16 MED ORDER — CETYLPYRIDINIUM CHLORIDE 0.05 % MT LIQD: 7.0000 mL | Freq: Four times a day (QID) | OROMUCOSAL | Status: DC

## 2014-09-16 MED ORDER — SUCCINYLCHOLINE CHLORIDE 20 MG/ML IJ SOLN
INTRAMUSCULAR | Status: AC
  Administered 2014-09-16: 120 mg
  Filled 2014-09-16: qty 1

## 2014-09-16 MED ORDER — LORAZEPAM 2 MG/ML IJ SOLN
0.2500 mg | Freq: Four times a day (QID) | INTRAMUSCULAR | Status: DC | PRN
  Administered 2014-09-16: 0.25 mg via INTRAVENOUS
  Filled 2014-09-16: qty 1

## 2014-09-16 MED ORDER — ETOMIDATE 2 MG/ML IV SOLN
INTRAVENOUS | Status: AC
  Administered 2014-09-16: 10 mg
  Filled 2014-09-16: qty 20

## 2014-09-16 MED ORDER — PANTOPRAZOLE SODIUM 40 MG IV SOLR
40.0000 mg | Freq: Every day | INTRAVENOUS | Status: DC
  Administered 2014-09-17: 40 mg via INTRAVENOUS
  Filled 2014-09-16 (×2): qty 40

## 2014-09-16 MED ORDER — INSULIN ASPART 100 UNIT/ML ~~LOC~~ SOLN
1.0000 [IU] | SUBCUTANEOUS | Status: DC
  Administered 2014-09-17: 3 [IU] via SUBCUTANEOUS

## 2014-09-16 MED ORDER — PROPOFOL 1000 MG/100ML IV EMUL
INTRAVENOUS | Status: AC
  Filled 2014-09-16: qty 100

## 2014-09-16 MED ORDER — LIDOCAINE HCL (CARDIAC) 20 MG/ML IV SOLN
INTRAVENOUS | Status: AC
  Filled 2014-09-16: qty 5

## 2014-09-16 MED ORDER — FENTANYL CITRATE (PF) 2500 MCG/50ML IJ SOLN
INTRAMUSCULAR | Status: AC
  Filled 2014-09-16: qty 50

## 2014-09-16 MED ORDER — ETOMIDATE 2 MG/ML IV SOLN
INTRAVENOUS | Status: DC | PRN
  Administered 2014-09-16: 10 mg via INTRAVENOUS
  Administered 2014-09-16: 4 mg via INTRAVENOUS

## 2014-09-16 MED ORDER — SUCCINYLCHOLINE CHLORIDE 20 MG/ML IJ SOLN
INTRAMUSCULAR | Status: DC | PRN
  Administered 2014-09-16: 100 mg via INTRAVENOUS

## 2014-09-16 MED ORDER — HEPARIN SODIUM (PORCINE) 5000 UNIT/ML IJ SOLN
5000.0000 [IU] | Freq: Three times a day (TID) | INTRAMUSCULAR | Status: DC
  Administered 2014-09-17 – 2014-09-27 (×32): 5000 [IU] via SUBCUTANEOUS
  Filled 2014-09-16 (×35): qty 1

## 2014-09-16 MED ORDER — ROCURONIUM BROMIDE 50 MG/5ML IV SOLN
INTRAVENOUS | Status: AC
  Filled 2014-09-16: qty 2

## 2014-09-16 MED ORDER — CHLORHEXIDINE GLUCONATE 0.12 % MT SOLN
15.0000 mL | Freq: Two times a day (BID) | OROMUCOSAL | Status: DC
  Administered 2014-09-16 – 2014-09-17 (×2): 15 mL via OROMUCOSAL
  Filled 2014-09-16: qty 15

## 2014-09-16 MED ORDER — DEXTROSE 5 % IV SOLN
1.0000 g | Freq: Once | INTRAVENOUS | Status: AC
  Administered 2014-09-17: 1 g via INTRAVENOUS
  Filled 2014-09-16: qty 1

## 2014-09-16 MED ORDER — FENTANYL CITRATE (PF) 100 MCG/2ML IJ SOLN
INTRAMUSCULAR | Status: AC
  Filled 2014-09-16: qty 2

## 2014-09-16 MED ORDER — PANTOPRAZOLE SODIUM 40 MG PO TBEC
40.0000 mg | DELAYED_RELEASE_TABLET | Freq: Every day | ORAL | Status: DC
  Administered 2014-09-16: 40 mg via ORAL
  Filled 2014-09-16: qty 1

## 2014-09-16 MED ORDER — SODIUM CHLORIDE 0.9 % IV SOLN
250.0000 mL | INTRAVENOUS | Status: DC | PRN
  Administered 2014-09-17: 250 mL via INTRAVENOUS

## 2014-09-16 MED ORDER — FENTANYL CITRATE (PF) 100 MCG/2ML IJ SOLN
100.0000 ug | Freq: Once | INTRAMUSCULAR | Status: AC
  Administered 2014-09-16: 100 ug via INTRAVENOUS

## 2014-09-16 MED ORDER — SODIUM CHLORIDE 0.9 % IV SOLN
25.0000 ug/h | INTRAVENOUS | Status: DC
  Administered 2014-09-16: 25 ug/h via INTRAVENOUS
  Filled 2014-09-16 (×2): qty 50

## 2014-09-16 MED ORDER — PROPOFOL 1000 MG/100ML IV EMUL
5.0000 ug/kg/min | INTRAVENOUS | Status: DC
  Administered 2014-09-16: 15 ug/kg/min via INTRAVENOUS
  Administered 2014-09-16: 30 ug/kg/min via INTRAVENOUS
  Administered 2014-09-17: 25 ug/kg/min via INTRAVENOUS
  Administered 2014-09-17: 40 ug/kg/min via INTRAVENOUS
  Filled 2014-09-16 (×3): qty 100

## 2014-09-16 MED ORDER — DEXTROSE 5 % IV SOLN
250.0000 mg | INTRAVENOUS | Status: AC
  Administered 2014-09-17 – 2014-09-20 (×4): 250 mg via INTRAVENOUS
  Filled 2014-09-16 (×5): qty 250

## 2014-09-16 NOTE — Progress Notes (Signed)
eLink Physician-Brief Progress Note Patient Name: Roger Ashley DOB: 20-Dec-1945 MRN: [id number]   Date of Service  09/16/2014  HPI/Events of Note  Asked by nurse to evaluate the patient.  Patient admitted with bilateral pulmonary infiltrates presumed to be pneumonia with possibly component of edema.  He is on Bipap requiring 100% FIO2 breathing 30-40 times per minute.  CXR at 1044 with severe bilateral infiltrates.  He is also suffering from acute renal failure.  eICU Interventions  Patient requires intubation followed by stabilization on mechanical ventilation then transfer to [facility] ICU. Will attempt to contact attending physician to notify now.     Intervention Category Major Interventions: Hypoxemia - evaluation and management  Henry Russel, P 09/16/2014, 4:24 PM

## 2014-09-16 NOTE — Progress Notes (Signed)
ANTIBIOTIC CONSULT NOTE - INITIAL  Pharmacy Consult for vancomycin and cefepime Indication: rule out pneumonia  Allergies  Allergen Reactions  . Clonidine Derivatives Other (See Comments)    Hallucinations; nightmares  . Crestor [Rosuvastatin] Other (See Comments)    myalgias    Patient Measurements: Height: 5\' 6"  (167.6 cm) Weight: 208 lb 1.8 oz (94.4 kg) IBW/kg (Calculated) : 63.8  Vital Signs: Temp: 99.7 F (37.6 C) (05/21 2000) Temp Source: Oral (05/21 2000) BP: 134/67 mmHg (05/21 2200) Pulse Rate: 106 (05/21 2200) Intake/Output from previous day: 05/20 0701 - 05/21 0700 In: 570 [P.O.:200; IV Piggyback:370] Out: 3350 [Urine:3350] Intake/Output from this shift: Total I/O In: 66.5 [I.V.:66.5] Out: 200 [Urine:200]  Labs:  Recent Labs  Sep 24, 2014 1305 09/16/14 0500  WBC 14.0* 14.3*  HGB 10.3* 9.6*  PLT 264 227  CREATININE 5.02* 5.25*   Estimated Creatinine Clearance: 14.9 mL/min (by C-G formula based on Cr of 5.25).    Microbiology: Recent Results (from the past 720 hour(s))  MRSA PCR Screening     Status: None   Collection Time: 2014-09-24  4:00 PM  Result Value Ref Range Status   MRSA by PCR NEGATIVE NEGATIVE Final    Comment:        The GeneXpert MRSA Assay (FDA approved for NASAL specimens only), is one component of a comprehensive MRSA colonization surveillance program. It is not intended to diagnose MRSA infection nor to guide or monitor treatment for MRSA infections.   Culture, blood (routine x 2) Call MD if unable to obtain prior to antibiotics being given     Status: None (Preliminary result)   Collection Time: Sep 24, 2014  6:15 PM  Result Value Ref Range Status   Specimen Description BLOOD RIGHT HAND  Final   Special Requests BOTTLES DRAWN AEROBIC AND ANAEROBIC 6CC  Final   Culture NO GROWTH 1 DAY  Final   Report Status PENDING  Incomplete  Culture, blood (routine x 2) Call MD if unable to obtain prior to antibiotics being given     Status:  None (Preliminary result)   Collection Time: 2014-09-24  6:20 PM  Result Value Ref Range Status   Specimen Description BLOOD RIGHT ARM  Final   Special Requests BOTTLES DRAWN AEROBIC AND ANAEROBIC 6CC  Final   Culture NO GROWTH 1 DAY  Final   Report Status PENDING  Incomplete    Medical History: Past Medical History  Diagnosis Date  . Nonischemic cardiomyopathy 2002    LVEF 15-20% initially; EF recovred to 50% by Echo in 03/2002; MUGA at [facility] EF 53%  . Diabetes mellitus type II   . Hyperlipidemia   . Essential hypertension   . 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 Erectile dysfunction   . Hearing impairment   . Anemia   . Acute respiratory failure   . CAP (community acquired pneumonia)   . Sepsis     2016    Medications:  Prescriptions prior to admission  Medication Sig Dispense Refill Last Dose  . aspirin 81 MG tablet Take 81 mg by mouth daily.     Sep 24, 2014 at Unknown time  . calcitRIOL (ROCALTROL) 0.25 MCG capsule Take 0.25 mcg by mouth every other day. Monday, Wednesday and Friday   09/24/14 at Unknown time  . cyanocobalamin 100 MCG tablet Take 100 mcg by mouth daily.   09-24-14 at Unknown time  . furosemide (LASIX) 40 MG tablet Take 40 mg by mouth 2 (two) times daily.  10/10/2014 at Unknown time  . glipiZIDE (GLUCOTROL) 5 MG tablet Take 10 mg by mouth 2 (two) times daily before a meal.     10-10-14 at Unknown time  . hydrALAZINE (APRESOLINE) 50 MG tablet Take 50 mg by mouth 3 (three) times daily.   2014/10/10 at Unknown time  . metoprolol (LOPRESSOR) 50 MG tablet Take 100 mg by mouth 2 (two) times daily.     10-10-2014 at 1130  . Multiple Vitamins-Minerals (PRESERVISION AREDS 2 PO) Take by mouth.   10/10/14 at Unknown time  . NIFEdipine (ADALAT CC) 90 MG 24 hr tablet Take 90 mg by mouth daily.   Oct 10, 2014 at Unknown time   Scheduled:  . [START ON 09/17/2014] antiseptic oral rinse  7 mL Mouth Rinse QID  . aspirin EC  81 mg Oral Daily  .  azithromycin  500 mg Intravenous Q24H  . calcitRIOL  0.25 mcg Oral Q M,W,F-1800  . cefTRIAXone (ROCEPHIN)  IV  1 g Intravenous Q24H  . chlorhexidine  15 mL Mouth Rinse BID  . fentaNYL      . furosemide  200 mg Intravenous Q12H  . heparin  5,000 Units Subcutaneous 3 times per day  . heparin  5,000 Units Subcutaneous 3 times per day  . hydrALAZINE  50 mg Oral TID  . insulin aspart  0-15 Units Subcutaneous TID WC  . insulin aspart  0-5 Units Subcutaneous QHS  . ipratropium-albuterol  3 mL Nebulization Q4H  . lidocaine (cardiac) 100 mg/22ml      . methylPREDNISolone (SOLU-MEDROL) injection  40 mg Intravenous Q12H  . metoprolol  100 mg Oral BID  . NIFEdipine  90 mg Oral Daily  . pantoprazole  40 mg Oral Q1200  . pantoprazole (PROTONIX) IV  40 mg Intravenous QHS  . propofol      . rocuronium      . cyanocobalamin  100 mcg Oral Daily   Infusions:  . fentaNYL infusion INTRAVENOUS 100 mcg/hr (09/16/14 2200)  . propofol (DIPRIVAN) infusion 30 mcg/kg/min (09/16/14 2243)    Assessment: 69yo male admitted 5/20 for SOB, started on azithro and Rocephin, now tx'd to MICU, CXR shows opacities 2/2 multi-focal PNA vs edema vs hemorrhage, to change to vanc and cefepime; noted CKD, still making urine.  Goal of Therapy:  Vancomycin trough level 15-20 mcg/ml  Plan:  Will begin vancomycin  IV Q48H and cefepime 1g IV Q24H and monitor CBC, Cx, levels prn.  Vernard Gambles, PharmD, BCPS  09/16/2014,11:26 PM

## 2014-09-16 NOTE — H&P (Signed)
PULMONARY / CRITICAL CARE MEDICINE HISTORY AND PHYSICAL EXAMINATION   Name: Roger Ashley MRN: [id number] DOB: Jun 20, 1945    ADMISSION DATE:  08/29/2014  PRIMARY SERVICE: PCCM  CHIEF COMPLAINT:  SOB  BRIEF PATIENT DESCRIPTION: 56 M with EF 40-45%, CKD 4, DM, HTN who presented to [facility] with worsening SOB 2/2 ARDS vs aCHF and eventually required intubation. Transferred to [facility] for further management.   SIGNIFICANT EVENTS / STUDIES:  ABG on admission 7.336/36.2/69.2 (on BiPAP)   LINES / TUBES: PIV  CULTURES: Sputum Culture 5/21 Blood Culture x 2 5/20  ANTIBIOTICS: Azithro 5/20 - CTX 5/20 -  HISTORY OF PRESENT ILLNESS:  Roger Ashley is a 15 M with sCHF EF 40-45%, CKD 4, DM, HTN who presented to [facility] on 5/20 with worsening SOB. The following history was obtained from chart review and from the patient's wife as he is currently intubated and unable to provide any history. She notes that he had a non-specific malaise starting about two weeks ago but became more short of breath yesterday evening. He did not specifically complain about a new cough, fevers, or worsening fluid build up.   PAST MEDICAL HISTORY :  Past Medical History  Diagnosis Date  . Nonischemic cardiomyopathy 2002    LVEF 15-20% initially; EF recovred to 50% by Echo in 03/2002; [facility]GA at [facility] EF 53%  . Diabetes mellitus type II   . Hyperlipidemia   . Essential hypertension   . 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 Erectile dysfunction   . Hearing impairment   . Anemia   . Acute respiratory failure   . CAP (community acquired pneumonia)   . Sepsis     2016   Past Surgical History  Procedure Laterality Date  . Colonoscopy w/ polypectomy    . Av fistula placement     Prior to Admission medications   Medication Sig Start Date End Date Taking? Authorizing Provider  aspirin 81 MG tablet Take 81 mg by mouth daily.     Yes Historical Provider, MD  calcitRIOL (ROCALTROL) 0.25 MCG capsule Take 0.25  mcg by mouth every other day. Monday, Wednesday and Friday   Yes Historical Provider, MD  cyanocobalamin 100 MCG tablet Take 100 mcg by mouth daily.   Yes Historical Provider, MD  furosemide (LASIX) 40 MG tablet Take 40 mg by mouth 2 (two) times daily.     Yes Historical Provider, MD  glipiZIDE (GLUCOTROL) 5 MG tablet Take 10 mg by mouth 2 (two) times daily before a meal.     Yes Historical Provider, MD  hydrALAZINE (APRESOLINE) 50 MG tablet Take 50 mg by mouth 3 (three) times daily.   Yes Historical Provider, MD  metoprolol (LOPRESSOR) 50 MG tablet Take 100 mg by mouth 2 (two) times daily.     Yes Historical Provider, MD  Multiple Vitamins-Minerals (PRESERVISION AREDS 2 PO) Take by mouth.   Yes Historical Provider, MD  NIFEdipine (ADALAT CC) 90 MG 24 hr tablet Take 90 mg by mouth daily.   Yes Historical Provider, MD   Allergies  Allergen Reactions  . Clonidine Derivatives Other (See Comments)    Hallucinations; nightmares  . Crestor [Rosuvastatin] Other (See Comments)    myalgias    FAMILY HISTORY:  Family History  Problem Relation Age of Onset  . Diabetes Other    SOCIAL HISTORY:  reports that he quit smoking about 32 years ago. His smoking use included Cigarettes. He started smoking about 52 years ago.  He has a 40 pack-year smoking history. He has never used smokeless tobacco. He reports that he does not drink alcohol or use illicit drugs.  REVIEW OF SYSTEMS:  Unable to obtain secondary to patient condition.  SUBJECTIVE:   VITAL SIGNS: Temp:  [98.2 F (36.8 C)-101.1 F (38.4 C)] 99.7 F (37.6 C) (05/21 2000) Pulse Rate:  [91-117] 106 (05/21 2200) Resp:  [14-40] 14 (05/21 2200) BP: (107-192)/(53-89) 134/67 mmHg (05/21 2200) SpO2:  [90 %-100 %] 91 % (05/21 2200) FiO2 (%):  [75 %-100 %] 100 % (05/21 2200) Weight:  [208 lb 1.8 oz (94.4 kg)-212 lb 8.4 oz (96.4 kg)] 208 lb 1.8 oz (94.4 kg) (05/21 2200) HEMODYNAMICS:   VENTILATOR SETTINGS: Vent Mode:  [-] PRVC FiO2 (%):  [75  %-100 %] 100 % Set Rate:  [14 bmp] 14 bmp Vt Set:  [510 mL-580 mL] 580 mL PEEP:  [12 cmH20] 12 cmH20 Plateau Pressure:  [27 cmH20-28 cmH20] 28 cmH20 INTAKE / OUTPUT: Intake/Output      05/21 0701 - 05/22 0700   P.O.    I.V. (mL/kg) 67.8 (0.7)   IV Piggyback 70   Total Intake(mL/kg) 137.8 (1.5)   Urine (mL/kg/hr) 1300 (0.9)   Total Output 1300   Net -1162.2         PHYSICAL EXAMINATION: General:  Elderly M in NAD, Intubated Neuro:  Minimally responsive to stimulation, gaps to suctioning HEENT:  Sclera anicteric, conjunctiva pink, MMM, ETT present Neck: Trachea supple and midline, (-) LAN or JVD Cardiovascular:  RRR, NS1/S2, (-) MRG Lungs:  Diffuse crackles Abdomen:  S/NT/ND/(+)BS Musculoskeletal:  (-) C/C/E Skin:  Intact  LABS:  CBC  Recent Labs Lab 08/30/2014 1305 09/16/14 0500  WBC 14.0* 14.3*  HGB 10.3* 9.6*  HCT 32.9* 30.0*  PLT 264 227   Coag's No results for input(s): APTT, INR in the last 168 hours. BMET  Recent Labs Lab 09/06/2014 1305 09/16/14 0500  NA 143 142  K 4.3 4.5  CL 111 111  CO2 19* 17*  BUN 65* 71*  CREATININE 5.02* 5.25*  GLUCOSE 266* 207*   Electrolytes  Recent Labs Lab 09/14/2014 1305 09/16/14 0500  CALCIUM 8.7* 8.5*  PHOS  --  3.9   Sepsis Markers  Recent Labs Lab 08/27/2014 1617  LATICACIDVEN 1.4   ABG  Recent Labs Lab 09/18/2014 1345 09/16/14 0451 09/16/14 1901  PHART 7.336* 7.391 7.337*  PCO2ART 36.2 32.2* 37.0  PO2ART 69.3* 79.6* 59.8*   Liver Enzymes No results for input(s): AST, ALT, ALKPHOS, BILITOT, ALBUMIN in the last 168 hours. Cardiac Enzymes  Recent Labs Lab 08/30/2014 1305  TROPONINI 0.03   Glucose  Recent Labs Lab 09/01/2014 1644 09/14/2014 2046 09/16/14 2226  GLUCAP 192* 257* 275*    Imaging Dg Chest 1 View  09/16/2014   CLINICAL DATA:  Status post ET tube placement and OG-tube placement.  EXAM: CHEST  1 VIEW  COMPARISON:  09/16/2014  FINDINGS: ET tube terminates in the mid trachea. OG tube  courses inferior to the diaphragm, tip not included on this examination. Stable enlarged cardiac and mediastinal contours. Unchanged diffuse bilateral airspace opacities. Bilateral glenohumeral joint degenerative change.  IMPRESSION: ET tube terminates in the mid trachea.  OG tube is difficult to visualized however appears to course inferior to the diaphragm, tip not included on this evaluation.  No significant interval change diffuse bilateral airspace opacities potentially secondary to multi focal pneumonia, edema or hemorrhage.   Electronically Signed   By: Francis Gaines.D.  On: 09/16/2014 18:07   Dg Chest Port 1 View  09/16/2014   CLINICAL DATA:  69 year old male with respiratory failure  EXAM: PORTABLE CHEST - 1 VIEW  COMPARISON:  Prior chest x-ray 2014/10/07  FINDINGS: Persistent diffuse bilateral interstitial and airspace opacities in a batwing distribution most consistent with moderate to severe pulmonary edema. The degree of cardiomegaly is unchanged. No large effusion or pneumothorax. Bilateral glenohumeral joint osteoarthritis. No acute osseous abnormality.  IMPRESSION: Unchanged chest x-ray which remains most consistent with moderate to severe pulmonary edema. Other, less likely considerations include extensive multifocal pneumonia, and diffuse alveolar hemorrhage.   Electronically Signed   By: Malachy Moan M.D.   On: 09/16/2014 11:27   Dg Chest Port 1 View  10-07-2014   CLINICAL DATA:  Progressive shortness of breath. Nonproductive cough.  EXAM: PORTABLE CHEST - 1 VIEW  COMPARISON:  None.  FINDINGS: The patient has extensive bilateral consolidative pulmonary infiltrates. Heart size and pulmonary vascularity are normal. No effusions. No acute osseous abnormality.  IMPRESSION: Extensive bilateral pulmonary infiltrates.   Electronically Signed   By: Francene Boyers M.D.   On: Oct 07, 2014 13:46    EKG: Reviewed CXR: Reviewed, dense bilateral opacities  ASSESSMENT / PLAN:  Principal  Problem:   Acute respiratory failure Active Problems:   Non-ischemic cardiomyopathy   Diabetes mellitus, type 2   Hyperlipidemia   Hypertension   Pulmonary edema   Acute renal failure superimposed on stage 4 chronic kidney disease   CAP (community acquired pneumonia)   Sepsis   Acute diastolic heart failure   Acute pulmonary edema   PULMONARY A: Acute hypoxemic respiratory failure: The DDx includes ARDS and acute heart failure however given the absence of other evidence of fluid overload ARDS is heavily favored.  P:   ARDS Protocol Ventilation VAP Prevention Initially favor heavy sedation given ventilator dyssyncrony SBT/WUA when appropriate Check BNP  CARDIOVASCULAR A: Chronic Systolic HF: Again, feel patient not acutely overloaded. Unclear why on both CCB and BB. Per chart review most care at [facility].  HTN: Currently not hypertensive likely 2/2 propofol P:   Hold Metoprolol and Nifepidine given possibility of acute heart failure Hold Hydralazine given hypotension  Strive for net even Consider placing Swan if difficult to wean  RENAL A: AKI on CKD: Likely 2/2 aggressive diuresis. P:   Net even Serial BMPs Check Urine Lytes  GASTROINTESTINAL A: No acute issues Start TF as anticipate long weaning  HEMATOLOGIC A: Anemia: Likely AOCD P:   Monitor  INFECTIOUS A: HCAP vs CAP:  P:   Broaden ABx to Vanc, Cefepime, and azithro given unclear medical backround  ENDOCRINE A: DM:  P:   Hold glipizide SSI  NEUROLOGIC A: No acute issues:    BEST PRACTICE / DISPOSITION Level of Care:  ICU Primary Service:  PCCM Consultants:  None Code Status:  Full Diet:  Start TFs DVT Px:  SQH GI Px:  PPI Skin Integrity:  Intact Social / Family:  Wife updated at bedside 5/21  TODAY'S SUMMARY:   I have personally obtained a history, examined the patient, evaluated laboratory and imaging results, formulated the assessment and plan and placed orders.  CRITICAL CARE:  The patient is critically ill with multiple organ systems failure and requires high complexity decision making for assessment and support, frequent evaluation and titration of therapies, application of advanced monitoring technologies and extensive interpretation of multiple databases. Critical Care Time devoted to patient care services described in this note is 45 minutes.   Madelaine Bhat  Curt Bears, MD Pulmonary and Critical Care Medicine [facility] Pager: [phone]   09/16/2014, 10:55 PM

## 2014-09-16 NOTE — Progress Notes (Signed)
Changing Back to 14/6 from CPAP of 12.  He still is breathing 30 + times a minute. Patient appears comfortable Saturation still drops very quickly.

## 2014-09-16 NOTE — Plan of Care (Signed)
Problem: ICU Phase Progression Outcomes Goal: O2 sats trending toward baseline Outcome: Progressing Patient trending up on the amount of required oxygen to maintain O2 sats Goal: Hemodynamically stable Outcome: Progressing Vital signs are stable and trending more towards baseline Goal: Pain controlled with appropriate interventions Outcome: Progressing Currently denies pain but has PRN medications is needed Goal: Initial discharge plan identified Outcome: Completed/Met Date Met:  09/16/14 Home with family Goal: Voiding-avoid urinary catheter unless indicated Outcome: Progressing Currently has an indwelling urinary catheter

## 2014-09-16 NOTE — Progress Notes (Signed)
Increased VT to 580 , from 510 to prevent breath stacking peak pressures are 22 plateau 28 after change.

## 2014-09-16 NOTE — Progress Notes (Signed)
Subjective: This is a patient that I saw in my office yesterday as a work in for cough and congestion. Exam shows he had rhonchi bilaterally his oxygen saturation was in the mid 90s on room air and he was treated with IM Rocephin and Depo-Medrol. He was on Zithromax and a Medrol Dosepak. I think after he left my office he developed acute pulmonary edema and ended up in the hospital. He gets most of his care at the [place] and I don't have details of all that. He looks like he is better today. He is still on BiPAP.  Objective: Vital signs in last 24 hours: Temp:  [97.3 F (36.3 C)-100.6 F (38.1 C)] 99.1 F (37.3 C) (05/21 0730) Pulse Rate:  [78-109] 97 (05/21 0800) Resp:  [21-40] 27 (05/21 0800) BP: (101-194)/(64-118) 159/64 mmHg (05/21 0800) SpO2:  [90 %-100 %] 97 % (05/21 0800) FiO2 (%):  [55 %-100 %] 100 % (05/21 0751) Weight:  [96.4 kg (212 lb 8.4 oz)-99.791 kg (220 lb)] 96.4 kg (212 lb 8.4 oz) (05/21 0500) Weight change:  Last BM Date: 09/14/14  Intake/Output from previous day: 05/20 0701 - 05/21 0700 In: 570 [P.O.:200; IV Piggyback:370] Out: 3350 [Urine:3350]  PHYSICAL EXAM General appearance: alert, cooperative, mild distress and On BiPAP Resp: rales bilaterally and rhonchi bilaterally Cardio: regular rate and rhythm, S1, S2 normal, no murmur, click, rub or gallop GI: soft, non-tender; bowel sounds normal; no masses,  no organomegaly Extremities: extremities normal, atraumatic, no cyanosis or edema  Lab Results:  Results for orders placed or performed during the hospital encounter of 08/31/2014 (from the past 48 hour(s))  Basic metabolic panel     Status: Abnormal   Collection Time: 09/17/2014  1:05 PM  Result Value Ref Range   Sodium 143 135 - 145 mmol/L   Potassium 4.3 3.5 - 5.1 mmol/L   Chloride 111 101 - 111 mmol/L   CO2 19 (L) 22 - 32 mmol/L   Glucose, Bld 266 (H) 65 - 99 mg/dL   BUN 65 (H) 6 - 20 mg/dL   Creatinine, Ser 5.02 (H) 0.61 - 1.24 mg/dL   Calcium 8.7 (L) 8.9 -  10.3 mg/dL   GFR calc non Af Amer 11 (L) >60 mL/min   GFR calc Af Amer 13 (L) >60 mL/min    Comment: (NOTE) The eGFR has been calculated using the CKD EPI equation. This calculation has not been validated in all clinical situations. eGFR's persistently <60 mL/min signify possible Chronic Kidney Disease.    Anion gap 13 5 - 15  CBC with Differential     Status: Abnormal   Collection Time: 09/14/2014  1:05 PM  Result Value Ref Range   WBC 14.0 (H) 4.0 - 10.5 K/uL   RBC 3.72 (L) 4.22 - 5.81 MIL/uL   Hemoglobin 10.3 (L) 13.0 - 17.0 g/dL   HCT 32.9 (L) 39.0 - 52.0 %   MCV 88.4 78.0 - 100.0 fL   MCH 27.7 26.0 - 34.0 pg   MCHC 31.3 30.0 - 36.0 g/dL   RDW 13.8 11.5 - 15.5 %   Platelets 264 150 - 400 K/uL   Neutrophils Relative % 71 43 - 77 %   Neutro Abs 9.9 (H) 1.7 - 7.7 K/uL   Lymphocytes Relative 23 12 - 46 %   Lymphs Abs 3.3 0.7 - 4.0 K/uL   Monocytes Relative 5 3 - 12 %   Monocytes Absolute 0.8 0.1 - 1.0 K/uL   Eosinophils Relative 1 0 - 5 %  Eosinophils Absolute 0.1 0.0 - 0.7 K/uL   Basophils Relative 0 0 - 1 %   Basophils Absolute 0.0 0.0 - 0.1 K/uL  Troponin I     Status: None   Collection Time: 09/06/2014  1:05 PM  Result Value Ref Range   Troponin I 0.03 <0.031 ng/mL    Comment:        NO INDICATION OF MYOCARDIAL INJURY.   Hemoglobin A1c     Status: None   Collection Time: 09/09/2014  1:05 PM  Result Value Ref Range   Hgb A1c MFr Bld 5.4 4.8 - 5.6 %    Comment: (NOTE)         Pre-diabetes: 5.7 - 6.4         Diabetes: >6.4         Glycemic control for adults with diabetes: <7.0    Mean Plasma Glucose 108 mg/dL    Comment: (NOTE) Performed At: [facility] [place], [place] [id number] Lindon Romp MD DQ:[id number]   Blood gas, arterial ([facility] & [facility] ONLY)     Status: Abnormal   Collection Time: 09/07/2014  1:45 PM  Result Value Ref Range   FIO2 55.00 %   Delivery systems BILEVEL POSITIVE AIRWAY PRESSURE    Rate 6 resp/min   Inspiratory PAP 16     Expiratory PAP 8    pH, Arterial 7.336 (L) 7.350 - 7.450   pCO2 arterial 36.2 35.0 - 45.0 mmHg   pO2, Arterial 69.3 (L) 80.0 - 100.0 mmHg   Bicarbonate 18.8 (L) 20.0 - 24.0 mEq/L   TCO2 17.7 0 - 100 mmol/L   Acid-base deficit 5.9 (H) 0.0 - 2.0 mmol/L   O2 Saturation 92.9 %   Patient temperature 37.0    Collection site RIGHT RADIAL    Drawn by COLLECTED BY RT    Sample type ARTERIAL    Allens test (pass/fail) PASS PASS  MRSA PCR Screening     Status: None   Collection Time: 09/08/2014  4:00 PM  Result Value Ref Range   MRSA by PCR NEGATIVE NEGATIVE    Comment:        The GeneXpert MRSA Assay (FDA approved for NASAL specimens only), is one component of a comprehensive MRSA colonization surveillance program. It is not intended to diagnose MRSA infection nor to guide or monitor treatment for MRSA infections.   Strep pneumoniae urinary antigen     Status: None   Collection Time: 09/21/2014  4:00 PM  Result Value Ref Range   Strep Pneumo Urinary Antigen NEGATIVE NEGATIVE    Comment:        Infection due to S. pneumoniae cannot be absolutely ruled out since the antigen present may be below the detection limit of the test. Performed at [facility]   Lactic acid, plasma     Status: None   Collection Time: 09/08/2014  4:17 PM  Result Value Ref Range   Lactic Acid, Venous 1.4 0.5 - 2.0 mmol/L  TSH     Status: None   Collection Time: 09/08/2014  4:17 PM  Result Value Ref Range   TSH 0.924 0.350 - 4.500 uIU/mL  Glucose, capillary     Status: Abnormal   Collection Time: 09/24/2014  4:44 PM  Result Value Ref Range   Glucose-Capillary 192 (H) 65 - 99 mg/dL   Comment 1 Notify RN    Comment 2 Document in Chart   Influenza panel by pcr     Status: None  Collection Time: 09/14/2014  5:00 PM  Result Value Ref Range   Influenza A By PCR NEGATIVE NEGATIVE   Influenza B By PCR NEGATIVE NEGATIVE   H1N1 flu by pcr NOT DETECTED NOT DETECTED    Comment:        The Xpert Flu assay  (FDA approved for nasal aspirates or washes and nasopharyngeal swab specimens), is intended as an aid in the diagnosis of influenza and should not be used as a sole basis for treatment.   Culture, blood (routine x 2) Call MD if unable to obtain prior to antibiotics being given     Status: None (Preliminary result)   Collection Time: 09/21/2014  6:15 PM  Result Value Ref Range   Specimen Description BLOOD RIGHT HAND    Special Requests BOTTLES DRAWN AEROBIC AND ANAEROBIC 6CC    Culture PENDING    Report Status PENDING   Culture, blood (routine x 2) Call MD if unable to obtain prior to antibiotics being given     Status: None (Preliminary result)   Collection Time: 09/26/2014  6:20 PM  Result Value Ref Range   Specimen Description BLOOD RIGHT ARM    Special Requests BOTTLES DRAWN AEROBIC AND ANAEROBIC 6CC    Culture PENDING    Report Status PENDING   Glucose, capillary     Status: Abnormal   Collection Time: 09/17/2014  8:46 PM  Result Value Ref Range   Glucose-Capillary 257 (H) 65 - 99 mg/dL  Blood gas, arterial     Status: Abnormal   Collection Time: 09/16/14  4:51 AM  Result Value Ref Range   FIO2 100.00 %   Delivery systems BILEVEL POSITIVE AIRWAY PRESSURE    Inspiratory PAP 14    Expiratory PAP 6    pH, Arterial 7.391 7.350 - 7.450   pCO2 arterial 32.2 (L) 35.0 - 45.0 mmHg   pO2, Arterial 79.6 (L) 80.0 - 100.0 mmHg   Bicarbonate 19.1 (L) 20.0 - 24.0 mEq/L   TCO2 17.6 0 - 100 mmol/L   Acid-base deficit 4.9 (H) 0.0 - 2.0 mmol/L   O2 Saturation 96.0 %   Patient temperature 37.0    Collection site RIGHT RADIAL    Drawn by [id number]    Sample type ARTERIAL    Allens test (pass/fail) PASS PASS  Phosphorus     Status: None   Collection Time: 09/16/14  5:00 AM  Result Value Ref Range   Phosphorus 3.9 2.5 - 4.6 mg/dL  Basic metabolic panel     Status: Abnormal   Collection Time: 09/16/14  5:00 AM  Result Value Ref Range   Sodium 142 135 - 145 mmol/L   Potassium 4.5 3.5 - 5.1  mmol/L   Chloride 111 101 - 111 mmol/L   CO2 17 (L) 22 - 32 mmol/L   Glucose, Bld 207 (H) 65 - 99 mg/dL   BUN 71 (H) 6 - 20 mg/dL   Creatinine, Ser 5.25 (H) 0.61 - 1.24 mg/dL   Calcium 8.5 (L) 8.9 - 10.3 mg/dL   GFR calc non Af Amer 10 (L) >60 mL/min   GFR calc Af Amer 12 (L) >60 mL/min    Comment: (NOTE) The eGFR has been calculated using the CKD EPI equation. This calculation has not been validated in all clinical situations. eGFR's persistently <60 mL/min signify possible Chronic Kidney Disease.    Anion gap 14 5 - 15  CBC     Status: Abnormal   Collection Time: 09/16/14  5:00 AM  Result Value Ref Range   WBC 14.3 (H) 4.0 - 10.5 K/uL   RBC 3.44 (L) 4.22 - 5.81 MIL/uL   Hemoglobin 9.6 (L) 13.0 - 17.0 g/dL   HCT 30.0 (L) 39.0 - 52.0 %   MCV 87.2 78.0 - 100.0 fL   MCH 27.9 26.0 - 34.0 pg   MCHC 32.0 30.0 - 36.0 g/dL   RDW 13.6 11.5 - 15.5 %   Platelets 227 150 - 400 K/uL    ABGS  Recent Labs  09/16/14 0451  PHART 7.391  PO2ART 79.6*  TCO2 17.6  HCO3 19.1*   CULTURES Recent Results (from the past 240 hour(s))  MRSA PCR Screening     Status: None   Collection Time: 09/03/2014  4:00 PM  Result Value Ref Range Status   MRSA by PCR NEGATIVE NEGATIVE Final    Comment:        The GeneXpert MRSA Assay (FDA approved for NASAL specimens only), is one component of a comprehensive MRSA colonization surveillance program. It is not intended to diagnose MRSA infection nor to guide or monitor treatment for MRSA infections.   Culture, blood (routine x 2) Call MD if unable to obtain prior to antibiotics being given     Status: None (Preliminary result)   Collection Time: 09/25/2014  6:15 PM  Result Value Ref Range Status   Specimen Description BLOOD RIGHT HAND  Final   Special Requests BOTTLES DRAWN AEROBIC AND ANAEROBIC 6CC  Final   Culture PENDING  Incomplete   Report Status PENDING  Incomplete  Culture, blood (routine x 2) Call MD if unable to obtain prior to antibiotics  being given     Status: None (Preliminary result)   Collection Time: 09/23/2014  6:20 PM  Result Value Ref Range Status   Specimen Description BLOOD RIGHT ARM  Final   Special Requests BOTTLES DRAWN AEROBIC AND ANAEROBIC [facility]  Final   Culture PENDING  Incomplete   Report Status PENDING  Incomplete   Studies/Results: Dg Chest Port 1 View  09/23/2014   CLINICAL DATA:  Progressive shortness of breath. Nonproductive cough.  EXAM: PORTABLE CHEST - 1 VIEW  COMPARISON:  None.  FINDINGS: The patient has extensive bilateral consolidative pulmonary infiltrates. Heart size and pulmonary vascularity are normal. No effusions. No acute osseous abnormality.  IMPRESSION: Extensive bilateral pulmonary infiltrates.   Electronically Signed   By: Lorriane Shire M.D.   On: 09/01/2014 13:46    Medications:  Prior to Admission:  Prescriptions prior to admission  Medication Sig Dispense Refill Last Dose  . aspirin 81 MG tablet Take 81 mg by mouth daily.     09/24/2014 at Unknown time  . calcitRIOL (ROCALTROL) 0.25 MCG capsule Take 0.25 mcg by mouth every other day. Monday, Wednesday and Friday   09/18/2014 at Unknown time  . cyanocobalamin 100 MCG tablet Take 100 mcg by mouth daily.   09/24/2014 at Unknown time  . furosemide (LASIX) 40 MG tablet Take 40 mg by mouth 2 (two) times daily.     09/05/2014 at Unknown time  . glipiZIDE (GLUCOTROL) 5 MG tablet Take 10 mg by mouth 2 (two) times daily before a meal.     08/31/2014 at Unknown time  . hydrALAZINE (APRESOLINE) 50 MG tablet Take 50 mg by mouth 3 (three) times daily.   09/13/2014 at Unknown time  . metoprolol (LOPRESSOR) 50 MG tablet Take 100 mg by mouth 2 (two) times daily.     09/13/2014 at 1130  . Multiple Vitamins-Minerals (  PRESERVISION AREDS 2 PO) Take by mouth.   09/20/2014 at Unknown time  . NIFEdipine (ADALAT CC) 90 MG 24 hr tablet Take 90 mg by mouth daily.   09/25/2014 at Unknown time   Scheduled: . aspirin EC  81 mg Oral Daily  . azithromycin  500 mg  Intravenous Q24H  . calcitRIOL  0.25 mcg Oral Q M,W,F-1800  . cefTRIAXone (ROCEPHIN)  IV  1 g Intravenous Q24H  . furosemide  200 mg Intravenous Q12H  . heparin  5,000 Units Subcutaneous 3 times per day  . hydrALAZINE  50 mg Oral TID  . insulin aspart  0-15 Units Subcutaneous TID WC  . insulin aspart  0-5 Units Subcutaneous QHS  . ipratropium-albuterol  3 mL Nebulization Q4H  . methylPREDNISolone (SOLU-MEDROL) injection  40 mg Intravenous Q12H  . metoprolol  100 mg Oral BID  . NIFEdipine  90 mg Oral Daily  . pantoprazole  40 mg Oral Q1200  . cyanocobalamin  100 mcg Oral Daily   Continuous:  DTH:[id number] **OR** acetaminophen, bisacodyl, HYDROcodone-acetaminophen, morphine injection, ondansetron **OR** ondansetron (ZOFRAN) IV, senna-docusate, traZODone  Assesment: He was admitted with acute respiratory failure he has acute on chronic renal failure which is complicating his situation. He has nonischemic cardiomyopathy and acute pulmonary edema. Chest x-ray from today is pending. His oxygenation is better.. Principal Problem:   Acute respiratory failure Active Problems:   Non-ischemic cardiomyopathy   Diabetes mellitus, type 2   Hyperlipidemia   Hypertension   Pulmonary edema   Acute renal failure superimposed on stage 4 chronic kidney disease   CAP (community acquired pneumonia)   Sepsis   Acute diastolic heart failure   Acute pulmonary edema    Plan:Continue current treatments. Nephrology help is noted and appreciated. Cardiology consultation will be obtained on Monday when available. He will have chest x-ray today.   LOS: 1 day   Elora Wolter L 09/16/2014, 9:31 AM

## 2014-09-16 NOTE — Progress Notes (Signed)
Subjective: Interval History: has no complaint of nausea or vomiting. Patient states that he is feeling much better. He has occasional cough but no sputum production..  Objective: Vital signs in last 24 hours: Temp:  [97.3 F (36.3 C)-100.6 F (38.1 C)] 100.2 F (37.9 C) (05/21 0500) Pulse Rate:  [78-109] 94 (05/21 0753) Resp:  [21-40] 28 (05/21 0753) BP: (101-194)/(67-118) 174/67 mmHg (05/21 0600) SpO2:  [90 %-100 %] 98 % (05/21 0753) FiO2 (%):  [55 %-100 %] 100 % (05/21 0751) Weight:  [96.4 kg (212 lb 8.4 oz)-99.791 kg (220 lb)] 96.4 kg (212 lb 8.4 oz) (05/21 0500) Weight change:   Intake/Output from previous day: 05/20 0701 - 05/21 0700 In: 570 [P.O.:200; IV Piggyback:370] Out: 3350 [Urine:3350] Intake/Output this shift:    General appearance: alert, cooperative and no distress Resp: rhonchi posterior - bilateral and wheezes posterior - bilateral Cardio: regular rate and rhythm, S1, S2 normal, no murmur, click, rub or gallop GI: soft, non-tender; bowel sounds normal; no masses,  no organomegaly Extremities: edema Trace edema  Lab Results:  Recent Labs  09/23/2014 1305 09/16/14 0500  WBC 14.0* 14.3*  HGB 10.3* 9.6*  HCT 32.9* 30.0*  PLT 264 227   BMET:  Recent Labs  09/01/2014 1305 09/16/14 0500  NA 143 142  K 4.3 4.5  CL 111 111  CO2 19* 17*  GLUCOSE 266* 207*  BUN 65* 71*  CREATININE 5.02* 5.25*  CALCIUM 8.7* 8.5*   No results for input(s): PTH in the last 72 hours. Iron Studies: No results for input(s): IRON, TIBC, TRANSFERRIN, FERRITIN in the last 72 hours.  Studies/Results: Dg Chest Port 1 View  08/28/2014   CLINICAL DATA:  Progressive shortness of breath. Nonproductive cough.  EXAM: PORTABLE CHEST - 1 VIEW  COMPARISON:  None.  FINDINGS: The patient has extensive bilateral consolidative pulmonary infiltrates. Heart size and pulmonary vascularity are normal. No effusions. No acute osseous abnormality.  IMPRESSION: Extensive bilateral pulmonary  infiltrates.   Electronically Signed   By: Francene Boyers M.D.   On: 09/16/2014 13:46    I have reviewed the patient's current medications.  Assessment/Plan: Problem #1 difficulty breathing: Possibly a combination of CHF and possible pneumonia. Patient presently on Lasix and he has 3300 mL of urine since came. Patient seems to be feeling better however still is on BiPAP. Problem #2 anemia: His hemoglobin is low and at this moment seems to be declining. Problem #3 history of diabetes Problem #4 nonischemic cardiomyopathy Problem #5 hypertension: His blood pressure is reasonably controlled Problem #6 possible pneumonia: Patient is on antibiotics. He is afebrile but his Hittle blood cell count remains high. Problem #7 metabolic acidosis: Possibly related to his renal failure. Problem #8 renal failure chronic. Stage IV. Presently he doesn't have any uremic signs and symptoms. His creatinine seems to be slightly increasing most likely from fluid removal. Plan: We'll change Lasix to 120 g IV twice a day We'll start patient on sodium bicarbonate 650 mg by mouth twice a day We'll check basic metabolic panel, phosphorus and CBC in the morning     LOS: 1 day   Teaghan Formica S 09/16/2014,8:03 AM

## 2014-09-16 NOTE — Progress Notes (Signed)
Report given to Verlon Au RN at [facility]; [facility]link at the bedside updated on pt condition

## 2014-09-17 ENCOUNTER — Inpatient Hospital Stay (HOSPITAL_COMMUNITY): Payer: Commercial Managed Care - HMO

## 2014-09-17 DIAGNOSIS — N189 Chronic kidney disease, unspecified: Secondary | ICD-10-CM

## 2014-09-17 DIAGNOSIS — J9601 Acute respiratory failure with hypoxia: Secondary | ICD-10-CM

## 2014-09-17 DIAGNOSIS — N184 Chronic kidney disease, stage 4 (severe): Secondary | ICD-10-CM

## 2014-09-17 DIAGNOSIS — E1122 Type 2 diabetes mellitus with diabetic chronic kidney disease: Secondary | ICD-10-CM

## 2014-09-17 DIAGNOSIS — J810 Acute pulmonary edema: Secondary | ICD-10-CM

## 2014-09-17 DIAGNOSIS — N179 Acute kidney failure, unspecified: Secondary | ICD-10-CM

## 2014-09-17 LAB — BLOOD GAS, ARTERIAL
Acid-base deficit: 7.3 mmol/L — ABNORMAL HIGH (ref 0.0–2.0)
Acid-base deficit: 7 mmol/L — ABNORMAL HIGH (ref 0.0–2.0)
BICARBONATE: 18.9 meq/L — AB (ref 20.0–24.0)
Bicarbonate: 19.1 mEq/L — ABNORMAL LOW (ref 20.0–24.0)
Drawn by: 24513
Drawn by: 24513
FIO2: 80 %
FIO2: 70 %
LHR: 30 {breaths}/min
LHR: 35 {breaths}/min
MECHVT: 450 mL
MECHVT: 380 mL
O2 SAT: 91.4 %
O2 Saturation: 99.3 %
PATIENT TEMPERATURE: 98
PCO2 ART: 45.2 mmHg — AB (ref 35.0–45.0)
PCO2 ART: 45.3 mmHg — AB (ref 35.0–45.0)
PEEP/CPAP: 14 cmH2O
PEEP: 14 cmH2O
Patient temperature: 98
TCO2: 20.3 mmol/L (ref 0–100)
TCO2: 20.5 mmol/L (ref 0–100)
pH, Arterial: 7.242 — ABNORMAL LOW (ref 7.350–7.450)
pH, Arterial: 7.246 — ABNORMAL LOW (ref 7.350–7.450)
pO2, Arterial: 66.8 mmHg — ABNORMAL LOW (ref 80.0–100.0)
pO2, Arterial: 218 mmHg — ABNORMAL HIGH (ref 80.0–100.0)

## 2014-09-17 LAB — GLUCOSE, CAPILLARY
GLUCOSE-CAPILLARY: 282 mg/dL — AB (ref 65–99)
GLUCOSE-CAPILLARY: 206 mg/dL — AB (ref 65–99)
GLUCOSE-CAPILLARY: 150 mg/dL — AB (ref 65–99)
GLUCOSE-CAPILLARY: 140 mg/dL — AB (ref 65–99)
GLUCOSE-CAPILLARY: 158 mg/dL — AB (ref 65–99)
Glucose-Capillary: 128 mg/dL — ABNORMAL HIGH (ref 65–99)
Glucose-Capillary: 135 mg/dL — ABNORMAL HIGH (ref 65–99)
Glucose-Capillary: 184 mg/dL — ABNORMAL HIGH (ref 65–99)
Glucose-Capillary: 188 mg/dL — ABNORMAL HIGH (ref 65–99)
Glucose-Capillary: 249 mg/dL — ABNORMAL HIGH (ref 65–99)
Glucose-Capillary: 341 mg/dL — ABNORMAL HIGH (ref 65–99)

## 2014-09-17 LAB — IRON AND TIBC
Iron: 7 ug/dL — ABNORMAL LOW (ref 45–182)
Saturation Ratios: 3 % — ABNORMAL LOW (ref 17.9–39.5)
TIBC: 203 ug/dL — ABNORMAL LOW (ref 250–450)
UIBC: 196 ug/dL

## 2014-09-17 LAB — POCT I-STAT 3, ART BLOOD GAS (G3+)
ACID-BASE DEFICIT: 6 mmol/L — AB (ref 0.0–2.0)
Acid-base deficit: 7 mmol/L — ABNORMAL HIGH (ref 0.0–2.0)
BICARBONATE: 22.4 meq/L (ref 20.0–24.0)
Bicarbonate: 19.8 mEq/L — ABNORMAL LOW (ref 20.0–24.0)
O2 SAT: 91 %
O2 Saturation: 99 %
Patient temperature: 98.6
TCO2: 24 mmol/L (ref 0–100)
TCO2: 21 mmol/L (ref 0–100)
pCO2 arterial: 63.5 mmHg (ref 35.0–45.0)
pCO2 arterial: 40.1 mmHg (ref 35.0–45.0)
pH, Arterial: 7.155 — CL (ref 7.350–7.450)
pH, Arterial: 7.301 — ABNORMAL LOW (ref 7.350–7.450)
pO2, Arterial: 79 mmHg — ABNORMAL LOW (ref 80.0–100.0)
pO2, Arterial: 177 mmHg — ABNORMAL HIGH (ref 80.0–100.0)

## 2014-09-17 LAB — HIV ANTIBODY (ROUTINE TESTING W REFLEX): HIV Screen 4th Generation wRfx: NONREACTIVE

## 2014-09-17 LAB — BRAIN NATRIURETIC PEPTIDE: B NATRIURETIC PEPTIDE 5: 547.7 pg/mL — AB (ref 0.0–100.0)

## 2014-09-17 LAB — MAGNESIUM: Magnesium: 1.9 mg/dL (ref 1.7–2.4)

## 2014-09-17 LAB — PHOSPHORUS: Phosphorus: 6.8 mg/dL — ABNORMAL HIGH (ref 2.5–4.6)

## 2014-09-17 LAB — SODIUM, URINE, RANDOM: SODIUM UR: 27 mmol/L

## 2014-09-17 LAB — CREATININE, URINE, RANDOM: CREATININE, URINE: 168.6 mg/dL

## 2014-09-17 LAB — STREP PNEUMONIAE URINARY ANTIGEN: Strep Pneumo Urinary Antigen: NEGATIVE

## 2014-09-17 LAB — TROPONIN I: Troponin I: 0.1 ng/mL — ABNORMAL HIGH (ref <0.031)

## 2014-09-17 LAB — FERRITIN: FERRITIN: 140 ng/mL (ref 24–336)

## 2014-09-17 LAB — PROCALCITONIN: Procalcitonin: 62.06 ng/mL

## 2014-09-17 MED ORDER — ARTIFICIAL TEARS OP OINT
1.0000 "application " | TOPICAL_OINTMENT | Freq: Three times a day (TID) | OPHTHALMIC | Status: DC
  Administered 2014-09-17 – 2014-09-19 (×6): 1 via OPHTHALMIC
  Filled 2014-09-17: qty 3.5

## 2014-09-17 MED ORDER — PROPOFOL 1000 MG/100ML IV EMUL
5.0000 ug/kg/min | INTRAVENOUS | Status: DC
  Administered 2014-09-17 – 2014-09-18 (×9): 40 ug/kg/min via INTRAVENOUS
  Administered 2014-09-19: 20 ug/kg/min via INTRAVENOUS
  Administered 2014-09-19: 40 ug/kg/min via INTRAVENOUS
  Administered 2014-09-19: 20 ug/kg/min via INTRAVENOUS
  Administered 2014-09-20: 30 ug/kg/min via INTRAVENOUS
  Administered 2014-09-20: 5 ug/kg/min via INTRAVENOUS
  Administered 2014-09-20: 30 ug/kg/min via INTRAVENOUS
  Filled 2014-09-17 (×15): qty 100

## 2014-09-17 MED ORDER — CISATRACURIUM BOLUS VIA INFUSION
15.0000 mg | Freq: Once | INTRAVENOUS | Status: DC
  Filled 2014-09-17: qty 15

## 2014-09-17 MED ORDER — CISATRACURIUM BOLUS VIA INFUSION
0.1000 mg/kg | Freq: Once | INTRAVENOUS | Status: AC
  Administered 2014-09-17: 9.6 mg via INTRAVENOUS
  Filled 2014-09-17: qty 10

## 2014-09-17 MED ORDER — ROCURONIUM BROMIDE 50 MG/5ML IV SOLN
1.0000 mg/kg | Freq: Once | INTRAVENOUS | Status: AC
  Administered 2014-09-17: 94.4 mg via INTRAVENOUS
  Filled 2014-09-17 (×2): qty 9.44

## 2014-09-17 MED ORDER — INSULIN ASPART 100 UNIT/ML ~~LOC~~ SOLN
0.0000 [IU] | SUBCUTANEOUS | Status: DC
Start: 2014-09-17 — End: 2014-09-27
  Administered 2014-09-17 (×2): 4 [IU] via SUBCUTANEOUS
  Administered 2014-09-17: 3 [IU] via SUBCUTANEOUS
  Administered 2014-09-18: 7 [IU] via SUBCUTANEOUS
  Administered 2014-09-18: 11 [IU] via SUBCUTANEOUS
  Administered 2014-09-18: 4 [IU] via SUBCUTANEOUS
  Administered 2014-09-18: 3 [IU] via SUBCUTANEOUS
  Administered 2014-09-18: 7 [IU] via SUBCUTANEOUS
  Administered 2014-09-18: 4 [IU] via SUBCUTANEOUS
  Administered 2014-09-18: 7 [IU] via SUBCUTANEOUS
  Administered 2014-09-19: 4 [IU] via SUBCUTANEOUS
  Administered 2014-09-19: 3 [IU] via SUBCUTANEOUS
  Administered 2014-09-19: 4 [IU] via SUBCUTANEOUS
  Administered 2014-09-19: 3 [IU] via SUBCUTANEOUS
  Administered 2014-09-19 – 2014-09-20 (×2): 4 [IU] via SUBCUTANEOUS
  Administered 2014-09-20: 3 [IU] via SUBCUTANEOUS
  Administered 2014-09-20: 4 [IU] via SUBCUTANEOUS
  Administered 2014-09-20 – 2014-09-21 (×3): 7 [IU] via SUBCUTANEOUS
  Administered 2014-09-21: 4 [IU] via SUBCUTANEOUS
  Administered 2014-09-21 (×2): 7 [IU] via SUBCUTANEOUS
  Administered 2014-09-22 (×3): 4 [IU] via SUBCUTANEOUS
  Administered 2014-09-22 (×3): 7 [IU] via SUBCUTANEOUS
  Administered 2014-09-22 – 2014-09-23 (×2): 4 [IU] via SUBCUTANEOUS
  Administered 2014-09-23: 5 [IU] via SUBCUTANEOUS
  Administered 2014-09-23: 4 [IU] via SUBCUTANEOUS
  Administered 2014-09-23: 3 [IU] via SUBCUTANEOUS
  Administered 2014-09-23 (×2): 4 [IU] via SUBCUTANEOUS
  Administered 2014-09-24 (×2): 3 [IU] via SUBCUTANEOUS
  Administered 2014-09-24: 4 [IU] via SUBCUTANEOUS
  Administered 2014-09-24 (×2): 7 [IU] via SUBCUTANEOUS
  Administered 2014-09-24: 3 [IU] via SUBCUTANEOUS
  Administered 2014-09-25: 4 [IU] via SUBCUTANEOUS
  Administered 2014-09-25 (×2): 7 [IU] via SUBCUTANEOUS
  Administered 2014-09-25: 4 [IU] via SUBCUTANEOUS
  Administered 2014-09-25: 7 [IU] via SUBCUTANEOUS
  Administered 2014-09-26: 11 [IU] via SUBCUTANEOUS
  Administered 2014-09-26: 4 [IU] via SUBCUTANEOUS
  Administered 2014-09-26 (×3): 11 [IU] via SUBCUTANEOUS
  Administered 2014-09-27: 3 [IU] via SUBCUTANEOUS
  Administered 2014-09-27: 4 [IU] via SUBCUTANEOUS

## 2014-09-17 MED ORDER — FENTANYL CITRATE (PF) 100 MCG/2ML IJ SOLN
100.0000 ug | Freq: Once | INTRAMUSCULAR | Status: AC
Start: 2014-09-17 — End: 2014-09-17

## 2014-09-17 MED ORDER — OXEPA PO LIQD
1000.0000 mL | ORAL | Status: DC
  Administered 2014-09-17 – 2014-09-18 (×2): 1000 mL
  Filled 2014-09-17 (×3): qty 1000

## 2014-09-17 MED ORDER — CETYLPYRIDINIUM CHLORIDE 0.05 % MT LIQD
7.0000 mL | Freq: Four times a day (QID) | OROMUCOSAL | Status: DC
  Administered 2014-09-18 – 2014-09-27 (×38): 7 mL via OROMUCOSAL

## 2014-09-17 MED ORDER — FENTANYL CITRATE (PF) 100 MCG/2ML IJ SOLN: 100.0000 ug | Freq: Once | INTRAMUSCULAR | Status: AC | PRN

## 2014-09-17 MED ORDER — SODIUM CHLORIDE 0.9 % IV SOLN
INTRAVENOUS | Status: DC
  Administered 2014-09-17: 1.9 [IU]/h via INTRAVENOUS
  Filled 2014-09-17: qty 2.5

## 2014-09-17 MED ORDER — FENTANYL BOLUS VIA INFUSION
50.0000 ug | INTRAVENOUS | Status: DC | PRN
  Filled 2014-09-17: qty 50

## 2014-09-17 MED ORDER — SODIUM CHLORIDE 0.9 % IV SOLN
3.0000 ug/kg/min | INTRAVENOUS | Status: DC
  Administered 2014-09-17 (×2): 3 ug/kg/min via INTRAVENOUS
  Filled 2014-09-17 (×3): qty 20

## 2014-09-17 MED ORDER — PANTOPRAZOLE SODIUM 40 MG PO PACK
40.0000 mg | PACK | Freq: Every day | ORAL | Status: DC
  Administered 2014-09-17 – 2014-09-27 (×11): 40 mg
  Filled 2014-09-17 (×11): qty 20

## 2014-09-17 MED ORDER — SODIUM CHLORIDE 0.9 % IV SOLN
25.0000 ug/h | INTRAVENOUS | Status: DC
  Administered 2014-09-17 – 2014-09-18 (×3): 175 ug/h via INTRAVENOUS
  Administered 2014-09-19: 75 ug/h via INTRAVENOUS
  Administered 2014-09-21: 10 ug/h via INTRAVENOUS
  Administered 2014-09-23: 100 ug/h via INTRAVENOUS
  Filled 2014-09-17 (×5): qty 50

## 2014-09-17 MED ORDER — CHLORHEXIDINE GLUCONATE 0.12 % MT SOLN
15.0000 mL | Freq: Two times a day (BID) | OROMUCOSAL | Status: DC
  Administered 2014-09-17 – 2014-09-27 (×20): 15 mL via OROMUCOSAL
  Filled 2014-09-17 (×20): qty 15

## 2014-09-17 MED ORDER — CISATRACURIUM BESYLATE 20 MG/10ML IV SOLN
15.0000 mg | INTRAVENOUS | Status: DC
  Filled 2014-09-17: qty 10

## 2014-09-17 MED ORDER — DEXTROSE 5 % IV SOLN
2.0000 ug/min | INTRAVENOUS | Status: DC
  Administered 2014-09-17: 2 ug/min via INTRAVENOUS
  Administered 2014-09-17: 4 ug/min via INTRAVENOUS
  Administered 2014-09-19: 5 ug/min via INTRAVENOUS
  Filled 2014-09-17 (×4): qty 4

## 2014-09-17 MED ORDER — CISATRACURIUM BESYLATE 20 MG/10ML IV SOLN
15.0000 mg | INTRAVENOUS | Status: AC
  Administered 2014-09-17: 15 mg via INTRAVENOUS
  Filled 2014-09-17: qty 10

## 2014-09-17 MED ORDER — ASPIRIN 81 MG PO CHEW
81.0000 mg | CHEWABLE_TABLET | Freq: Every day | ORAL | Status: DC
  Administered 2014-09-17 – 2014-09-27 (×11): 81 mg
  Filled 2014-09-17 (×11): qty 1

## 2014-09-17 NOTE — Progress Notes (Signed)
PULMONARY / CRITICAL CARE MEDICINE HISTORY AND PHYSICAL EXAMINATION   Name: Roger Ashley MRN: [id number] DOB: 08-19-1945    ADMISSION DATE:  09/11/2014  PRIMARY SERVICE: PCCM  CHIEF COMPLAINT:  SOB  BRIEF PATIENT DESCRIPTION: 30 M with EF 40-45%, CKD 4, DM, HTN who presented to [facility] with worsening SOB 2/2 ARDS vs aCHF and eventually required intubation. Transferred to [facility] for further management.   SIGNIFICANT EVENTS / STUDIES:  5/20  Admit with respiratory distress, ABG on admission 7.336/36.2/69.2 (on BiPAP).  Intubated, ARDS protocol  5/22  Not synchronous on vent, CXR improved.  ARDS protocol d/c'd, f/u ABG >> 7.15/ 63/79/22.  Nimbex gtt started.   SUBJECTIVE: RN reports vent poor vent synchrony over night - received doses of paralytics x2.  ARDS protocol.  Insulin gtt started for hyperglycemia   VITAL SIGNS: Temp:  [97.4 F (36.3 C)-101.1 F (38.4 C)] 98.6 F (37 C) (05/22 0738) Pulse Rate:  [77-117] 87 (05/22 0830) Resp:  [11-36] 20 (05/22 0830) BP: (77-182)/(34-88) 107/44 mmHg (05/22 0815) SpO2:  [90 %-100 %] 91 % (05/22 0830) FiO2 (%):  [70 %-100 %] 70 % (05/22 0800) Weight:  [208 lb 1.8 oz (94.4 kg)-211 lb 6.7 oz (95.9 kg)] 211 lb 6.7 oz (95.9 kg) (05/22 0500)   HEMODYNAMICS: CVP:  [16 mmHg-19 mmHg] 19 mmHg   VENTILATOR SETTINGS: Vent Mode:  [-] PRVC FiO2 (%):  [70 %-100 %] 70 % Set Rate:  [14 bmp-35 bmp] 35 bmp Vt Set:  [380 mL-580 mL] 380 mL PEEP:  [12 cmH20-20 cmH20] 12 cmH20 Plateau Pressure:  [27 cmH20-33 cmH20] 28 cmH20   INTAKE / OUTPUT: Intake/Output      05/21 0701 - 05/22 0700 05/22 0701 - 05/23 0700   P.O.     I.V. (mL/kg) 296.2 (3.1) 99.7 (1)   IV Piggyback 320    Total Intake(mL/kg) 616.2 (6.4) 99.7 (1)   Urine (mL/kg/hr) 1410 (0.6)    Emesis/NG output 0 (0)    Total Output 1410     Net -793.8 +99.7          PHYSICAL EXAMINATION: General:  Older adult M in NAD, Intubated Neuro:  Sedate on vent, pupils 2-75mm =R HEENT:  Sclera anicteric,  conjunctiva pink, MMM, ETT present Neck: Trachea supple and midline, (-) LAN or JVD Cardiovascular:  RRR, NS1/S2, (-) MRG Lungs:  Not synchronous with vent, lungs bilaterally with crackles, coarse breath sounds Abdomen:  S/NT/ND/(+)BS Musculoskeletal: no acute deformities  Skin:  Warm/dry, trace BUE edema   LABS:  CBC  Recent Labs Lab 08/30/2014 1305 09/16/14 0500  WBC 14.0* 14.3*  HGB 10.3* 9.6*  HCT 32.9* 30.0*  PLT 264 227   Coag's No results for input(s): APTT, INR in the last 168 hours.   BMET  Recent Labs Lab 08/29/2014 1305 09/16/14 0500  NA 143 142  K 4.3 4.5  CL 111 111  CO2 19* 17*  BUN 65* 71*  CREATININE 5.02* 5.25*  GLUCOSE 266* 207*   Electrolytes  Recent Labs Lab 09/08/2014 1305 09/16/14 0500 09/17/14 0300  CALCIUM 8.7* 8.5*  --   MG  --   --  1.9  PHOS  --  3.9 6.8*   Sepsis Markers  Recent Labs Lab 08/27/2014 1617  LATICACIDVEN 1.4   ABG  Recent Labs Lab 09/16/14 1901 09/17/14 0137 09/17/14 0445  PHART 7.337* 7.242* 7.246*  PCO2ART 37.0 45.2* 45.3*  PO2ART 59.8* 66.8* 218*   Liver Enzymes No results for input(s): AST, ALT, ALKPHOS, BILITOT, ALBUMIN  in the last 168 hours.   Cardiac Enzymes  Recent Labs Lab October 11, 2014 1305  TROPONINI 0.03   Glucose  Recent Labs Lab 2014-10-11 1644 October 11, 2014 2046 09/16/14 2226 09/17/14 0024 09/17/14 0427 09/17/14 0548  GLUCAP 192* 257* 275* 341* 282* 249*    Imaging Dg Chest 1 View  09/16/2014   CLINICAL DATA:  Status post ET tube placement and OG-tube placement.  EXAM: CHEST  1 VIEW  COMPARISON:  09/16/2014  FINDINGS: ET tube terminates in the mid trachea. OG tube courses inferior to the diaphragm, tip not included on this examination. Stable enlarged cardiac and mediastinal contours. Unchanged diffuse bilateral airspace opacities. Bilateral glenohumeral joint degenerative change.  IMPRESSION: ET tube terminates in the mid trachea.  OG tube is difficult to visualized however appears to  course inferior to the diaphragm, tip not included on this evaluation.  No significant interval change diffuse bilateral airspace opacities potentially secondary to multi focal pneumonia, edema or hemorrhage.   Electronically Signed   By: Annia Belt M.D.   On: 09/16/2014 18:07   Dg Chest Port 1 View  09/17/2014   CLINICAL DATA:  Shortness of breath.  PICC placement.  EXAM: PORTABLE CHEST - 1 VIEW  COMPARISON:  One day prior at 1725 hour  FINDINGS: The enteric tube is 7.4 cm from the carina. Enteric tube in place, tip below the diaphragm not included in the field of view. Left internal jugular central venous catheter tip in the mid SVC. No definite additional upper extremity catheters are seen. No pneumothorax. There is improving perihilar aeration with persistent diffuse parenchymal opacities. Borderline cardiomegaly.  IMPRESSION: 1. Tip of the left internal jugular central line in the mid SVC. No pneumothorax. 2. Improving lung aeration with decreasing parenchymal opacities.   Electronically Signed   By: Rubye Oaks M.D.   On: 09/17/2014 04:36   Dg Chest Port 1 View  09/16/2014   CLINICAL DATA:  69 year old male with respiratory failure  EXAM: PORTABLE CHEST - 1 VIEW  COMPARISON:  Prior chest x-ray 10/11/2014  FINDINGS: Persistent diffuse bilateral interstitial and airspace opacities in a batwing distribution most consistent with moderate to severe pulmonary edema. The degree of cardiomegaly is unchanged. No large effusion or pneumothorax. Bilateral glenohumeral joint osteoarthritis. No acute osseous abnormality.  IMPRESSION: Unchanged chest x-ray which remains most consistent with moderate to severe pulmonary edema. Other, less likely considerations include extensive multifocal pneumonia, and diffuse alveolar hemorrhage.   Electronically Signed   By: Malachy Moan M.D.   On: 09/16/2014 11:27   Dg Chest Port 1 View  Oct 11, 2014   CLINICAL DATA:  Progressive shortness of breath. Nonproductive  cough.  EXAM: PORTABLE CHEST - 1 VIEW  COMPARISON:  None.  FINDINGS: The patient has extensive bilateral consolidative pulmonary infiltrates. Heart size and pulmonary vascularity are normal. No effusions. No acute osseous abnormality.  IMPRESSION: Extensive bilateral pulmonary infiltrates.   Electronically Signed   By: Francene Boyers M.D.   On: Oct 11, 2014 13:46    ASSESSMENT / PLAN:  Principal Problem:   Acute respiratory failure Active Problems:   Non-ischemic cardiomyopathy   Diabetes mellitus, type 2   Hyperlipidemia   Hypertension   Pulmonary edema   Acute renal failure superimposed on stage 4 chronic kidney disease   CAP (community acquired pneumonia)   Sepsis   Acute diastolic heart failure   Acute pulmonary edema   ARDS (adult respiratory distress syndrome)   PULMONARY A: Acute hypoxemic respiratory failure - DDx includes ARDS vs acute heart  failure.  BNP 547 on admit.  Diffuse Bilateral Airspace Disease  ARDS  P:   Adjust vent settings.  D/C ARDS protocol with follow up ABG (no synchrony/air hunger) Begin Nimbex gtt am 5/22  VAP Prevention Trend ABG, CXR  SBT/WUA when appropriate See ID  CARDIOVASCULAR L IJ TLC 5/20 >>  A: Chronic Systolic HF - Unclear why on both CCB and BB. Per chart review most care at [facility].  HTN - Currently not hypertensive likely 2/2 propofol P:   Hold Metoprolol, Nifepidine & hydralazine ICU monitoring  Net even goal for now  Levophed for MAP > 65 ASA   RENAL A: AKI on CKD - Likely 2/2 aggressive diuresis.  Ur cr 168, Ur Na 27 P:   Trend BMP / UOP  Replace electrolytes as indicated   GASTROINTESTINAL A: Vent Associated Dysphagia P:  Begin TF  NPO, OGT PPI   HEMATOLOGIC A: Anemia - Likely AOCD P:   Monitor CBC Heparin for DVT prophylaxis   INFECTIOUS Sputum Culture 5/21 >> Blood Culture x 2 5/20 >> Strep Pneumo 5/22 >> neg  A: HCAP vs CAP P:   Broaden ABx to Vanc, Cefepime, and azithro given unclear medical  backround Vanco, start date 5/21, D2/x  Cefepime, start date 5/21, D2/x  Azithro, start date 5/20, D3/x   PCT protocol   ENDOCRINE A: DM P:   Hold glipizide Transition to SSI, Resistant scale   NEUROLOGIC A: ICU Associated Discomfort  P:  Monitor neuro status  Propofol for sedation  Fentanyl gtt for pain    FAMILY:  No family available 5/22 am.     Canary Brim, NP-C [facility] Pgr: [phone] or [phone] 09/17/2014, 8:32 AM   PCCM ATTENDING: I have reviewed pt's initial presentation, consultants notes and hospital database in detail.  The above assessment and plan was formulated under my direction.  In summary: Intubated @ [facility] yesterday for acute hypoxic resp failure with extensive bilateral AS dz. He has previously known cardiomyopathy with LVEF 40-45% on most recetn echocardiogram. His CXR has cleared substantially suggesting that the infiltrates are edema. His resp pattern this AM was extremely unusual - very prolonged, forceful expiration which was severely limiting our ability to ventilate him. Therefore, we initiated NMB protocol. Will continue abx for now though I doubt this is an infectious process. Vent adjustments have been made. Need to R/O MI - EKG, biomarkers ordered. Renal service contacted to eval and manage AKI/CKD.    35 minutes of independent CCM time was provided by me   Billy Fischer, MD;  PCCM service; Mobile [phone]

## 2014-09-17 NOTE — Progress Notes (Signed)
eLink Physician-Brief Progress Note Patient Name: Roger Ashley DOB: [birth year] MRN: [id number]   Date of Service  09/17/2014  HPI/Events of Note  Ongoing hypoxia secondary to ARDS.  Patient dyssynchronous at times with mech vent  eICU Interventions  Dose of paralytic in hopes of improving oxygenation     Intervention Category Major Interventions: Hypoxemia - evaluation and management  Henry Russel, P 09/17/2014, 2:12 AM

## 2014-09-17 NOTE — Progress Notes (Signed)
eLink Physician-Brief Progress Note Patient Name: Huzaifah Hoggatt DOB: 08/08/1945 MRN: [id number]   Date of Service  09/17/2014  HPI/Events of Note  Oxygenation on ABG and CXR improving, however now that paralytic gone he is more dyssynchronous and O2 saturation starting to drop again  eICU Interventions  Give rocuronium x1 for vent synchrony Repeat ABG 3-4 hours May be able to help vent synchrony by coming off of ARDS protocol if continued improvement     Intervention Category Major Interventions: Respiratory failure - evaluation and management  MCQUAID, DOUGLAS 09/17/2014, 5:14 AM

## 2014-09-17 NOTE — Progress Notes (Signed)
Changes to Setting made for patient during [employer] Insertion. Protocol continued post insertion.

## 2014-09-17 NOTE — Procedures (Signed)
Central Venous Catheter Insertion Procedure Note Roger Ashley [id number] 08-27-1945  Procedure: Insertion of Central Venous Catheter Indications: Assessment of intravascular volume, Drug and/or fluid administration and Frequent blood sampling  Procedure Details Consent: Risks of procedure as well as the alternatives and risks of each were explained to the (patient/caregiver).  Consent for procedure obtained. Time Out: Verified patient identification, verified procedure, site/side was marked, verified correct patient position, special equipment/implants available, medications/allergies/relevent history reviewed, required imaging and test results available.  Performed  Maximum sterile technique was used including antiseptics, cap, gloves, gown, hand hygiene, mask and sheet. Skin prep: Chlorhexidine; local anesthetic administered A antimicrobial bonded/coated triple lumen catheter was placed in the left internal jugular vein using the Seldinger technique.  Evaluation Blood flow good Complications: No apparent complications Patient did tolerate procedure well. Chest X-ray ordered to verify placement.  CXR: pending.  Roger Ashley. 09/17/2014, 2:43 AM  I used ultrasound to locate and access the vein/artery.

## 2014-09-18 ENCOUNTER — Ambulatory Visit (HOSPITAL_COMMUNITY): Payer: Commercial Managed Care - HMO

## 2014-09-18 ENCOUNTER — Inpatient Hospital Stay (HOSPITAL_COMMUNITY): Payer: Commercial Managed Care - HMO

## 2014-09-18 DIAGNOSIS — N179 Acute kidney failure, unspecified: Secondary | ICD-10-CM | POA: Insufficient documentation

## 2014-09-18 DIAGNOSIS — I5031 Acute diastolic (congestive) heart failure: Secondary | ICD-10-CM

## 2014-09-18 DIAGNOSIS — N189 Chronic kidney disease, unspecified: Secondary | ICD-10-CM

## 2014-09-18 DIAGNOSIS — J9600 Acute respiratory failure, unspecified whether with hypoxia or hypercapnia: Secondary | ICD-10-CM

## 2014-09-18 LAB — COMPREHENSIVE METABOLIC PANEL
ALT: 16 U/L — AB (ref 17–63)
AST: 17 U/L (ref 15–41)
Albumin: 2.3 g/dL — ABNORMAL LOW (ref 3.5–5.0)
Alkaline Phosphatase: 53 U/L (ref 38–126)
Anion gap: 12 (ref 5–15)
BUN: 96 mg/dL — ABNORMAL HIGH (ref 6–20)
CO2: 19 mmol/L — ABNORMAL LOW (ref 22–32)
CREATININE: 7.21 mg/dL — AB (ref 0.61–1.24)
Calcium: 8 mg/dL — ABNORMAL LOW (ref 8.9–10.3)
Chloride: 106 mmol/L (ref 101–111)
GFR calc Af Amer: 8 mL/min — ABNORMAL LOW (ref >60)
GFR calc non Af Amer: 7 mL/min — ABNORMAL LOW (ref >60)
GLUCOSE: 239 mg/dL — AB (ref 65–99)
Potassium: 4.1 mmol/L (ref 3.5–5.1)
Sodium: 137 mmol/L (ref 135–145)
TOTAL PROTEIN: 6.3 g/dL — AB (ref 6.5–8.1)
Total Bilirubin: 0.7 mg/dL (ref 0.3–1.2)

## 2014-09-18 LAB — SODIUM, URINE, RANDOM: Sodium, Ur: 31 mmol/L

## 2014-09-18 LAB — BLOOD GAS, ARTERIAL
Acid-base deficit: 4.9 mmol/L — ABNORMAL HIGH (ref 0.0–2.0)
Bicarbonate: 19 mEq/L — ABNORMAL LOW (ref 20.0–24.0)
Drawn by: 24513
FIO2: 0.4 %
MECHVT: 510 mL
O2 SAT: 96.9 %
PEEP/CPAP: 5 cmH2O
Patient temperature: 97.3
RATE: 24 resp/min
TCO2: 20 mmol/L (ref 0–100)
pCO2 arterial: 30.4 mmHg — ABNORMAL LOW (ref 35.0–45.0)
pH, Arterial: 7.409 (ref 7.350–7.450)
pO2, Arterial: 72.5 mmHg — ABNORMAL LOW (ref 80.0–100.0)

## 2014-09-18 LAB — URINE MICROSCOPIC-ADD ON

## 2014-09-18 LAB — CBC
HCT: 24.4 % — ABNORMAL LOW (ref 39.0–52.0)
HEMOGLOBIN: 8 g/dL — AB (ref 13.0–17.0)
MCH: 28 pg (ref 26.0–34.0)
MCHC: 32.8 g/dL (ref 30.0–36.0)
MCV: 85.3 fL (ref 78.0–100.0)
Platelets: 169 10*3/uL (ref 150–400)
RBC: 2.86 MIL/uL — ABNORMAL LOW (ref 4.22–5.81)
RDW: 13.8 % (ref 11.5–15.5)
WBC: 13.3 10*3/uL — AB (ref 4.0–10.5)

## 2014-09-18 LAB — GLUCOSE, CAPILLARY
GLUCOSE-CAPILLARY: 128 mg/dL — AB (ref 65–99)
GLUCOSE-CAPILLARY: 183 mg/dL — AB (ref 65–99)
GLUCOSE-CAPILLARY: 235 mg/dL — AB (ref 65–99)
GLUCOSE-CAPILLARY: 248 mg/dL — AB (ref 65–99)
GLUCOSE-CAPILLARY: 274 mg/dL — AB (ref 65–99)
GLUCOSE-CAPILLARY: 282 mg/dL — AB (ref 65–99)
Glucose-Capillary: 237 mg/dL — ABNORMAL HIGH (ref 65–99)
Glucose-Capillary: 213 mg/dL — ABNORMAL HIGH (ref 65–99)
Glucose-Capillary: 226 mg/dL — ABNORMAL HIGH (ref 65–99)
Glucose-Capillary: 176 mg/dL — ABNORMAL HIGH (ref 65–99)

## 2014-09-18 LAB — URINALYSIS, ROUTINE W REFLEX MICROSCOPIC
Bilirubin Urine: NEGATIVE
Glucose, UA: NEGATIVE mg/dL
HGB URINE DIPSTICK: NEGATIVE
KETONES UR: NEGATIVE mg/dL
Leukocytes, UA: NEGATIVE
NITRITE: NEGATIVE
PROTEIN: 30 mg/dL — AB
SPECIFIC GRAVITY, URINE: 1.014 (ref 1.005–1.030)
Urobilinogen, UA: 0.2 mg/dL (ref 0.0–1.0)
pH: 5 (ref 5.0–8.0)

## 2014-09-18 LAB — BRAIN NATRIURETIC PEPTIDE: B Natriuretic Peptide: 101 pg/mL — ABNORMAL HIGH (ref 0.0–100.0)

## 2014-09-18 LAB — LEGIONELLA ANTIGEN, URINE

## 2014-09-18 LAB — PROCALCITONIN: PROCALCITONIN: 56.05 ng/mL

## 2014-09-18 LAB — TROPONIN I: Troponin I: 0.08 ng/mL — ABNORMAL HIGH (ref <0.031)

## 2014-09-18 LAB — CREATININE, URINE, RANDOM: Creatinine, Urine: 106.83 mg/dL

## 2014-09-18 LAB — UREA NITROGEN, URINE: UREA NITROGEN UR: 431 mg/dL

## 2014-09-18 LAB — TRIGLYCERIDES: Triglycerides: 182 mg/dL — ABNORMAL HIGH (ref <150)

## 2014-09-18 MED ORDER — PRO-STAT SUGAR FREE PO LIQD
30.0000 mL | Freq: Four times a day (QID) | ORAL | Status: DC
  Administered 2014-09-18 – 2014-09-21 (×12): 30 mL
  Filled 2014-09-18 (×15): qty 30

## 2014-09-18 MED ORDER — DOCUSATE SODIUM 50 MG/5ML PO LIQD
50.0000 mg | Freq: Every day | ORAL | Status: DC
  Administered 2014-09-18 – 2014-09-22 (×5): 50 mg via ORAL
  Filled 2014-09-18 (×6): qty 10

## 2014-09-18 MED ORDER — SODIUM CHLORIDE 0.9 % IV SOLN
250.0000 mL | INTRAVENOUS | Status: DC | PRN
  Administered 2014-09-19: 250 mL via INTRAVENOUS

## 2014-09-18 MED ORDER — INSULIN GLARGINE 100 UNIT/ML ~~LOC~~ SOLN
5.0000 [IU] | Freq: Every day | SUBCUTANEOUS | Status: DC
  Administered 2014-09-18 – 2014-09-26 (×9): 5 [IU] via SUBCUTANEOUS
  Filled 2014-09-18 (×10): qty 0.05

## 2014-09-18 MED ORDER — POLYETHYLENE GLYCOL 3350 17 G PO PACK
17.0000 g | PACK | Freq: Every day | ORAL | Status: DC
  Administered 2014-09-18 – 2014-09-22 (×5): 17 g via ORAL
  Filled 2014-09-18 (×6): qty 1

## 2014-09-18 MED ORDER — CEFTRIAXONE SODIUM IN DEXTROSE 20 MG/ML IV SOLN
1.0000 g | INTRAVENOUS | Status: AC
  Administered 2014-09-18 – 2014-09-24 (×7): 1 g via INTRAVENOUS
  Filled 2014-09-18 (×7): qty 50

## 2014-09-18 MED ORDER — OXEPA PO LIQD: 1000.0000 mL | ORAL | Status: DC

## 2014-09-18 MED ORDER — OXEPA PO LIQD
1000.0000 mL | ORAL | Status: DC
  Administered 2014-09-18 – 2014-09-20 (×3): 1000 mL
  Filled 2014-09-18 (×5): qty 1000

## 2014-09-18 NOTE — Progress Notes (Signed)
Initial Nutrition Assessment  DOCUMENTATION CODES:  Obesity unspecified  INTERVENTION:  Tube feeding, Prostat    Continue TF via OGT, decrease Oxepa to 10 ml/h (240 ml per day) with Prostat 30 ml QID to provide 760 kcals, 75 gm protein, 188 ml free water daily.  Total intake with TF + kcals from Propofol will be 1370 kcals, 75 gm protein (58% of estimated needs), 188 ml free water daily.  Unable to meet protein needs at this time due to Propofol.   NUTRITION DIAGNOSIS:  Inadequate oral intake related to inability to eat as evidenced by NPO status.   GOAL:  Provide needs based on ASPEN/SCCM guidelines   MONITOR:  Vent status, Labs, Weight trends, TF tolerance  REASON FOR ASSESSMENT:  Consult Enteral/tube feeding initiation and management  ASSESSMENT:  Patient presented to [facility] on 5/20 with worsening SOB due to ARDS vs CHF; required intubation. Transferred to [facility] for further management on 5/21.  Patient is currently intubated on ventilator support MV: 10.6 L/min Temp (24hrs), Avg:97.5 F (36.4 C), Min:97.4 F (36.3 C), Max:97.6 F (36.4 C)  Propofol: 23.1 ml/hr providing 610 kcals per day.  Patient is currently receiving Oxepa via OGT at 40 ml/h (960 ml/day) to provide 1440 kcals, 60 gm protein, 754 ml free water daily.   Discussed patient in ICU rounds and with RN today. Physician wishes to continue Oxepa TF formula for ARDS.  Height:  Ht Readings from Last 1 Encounters:  09/16/14 5\' 6"  (1.676 m)    Weight:  Wt Readings from Last 1 Encounters:  09/18/14 219 lb 2.2 oz (99.4 kg)    Ideal Body Weight:  64.5 kg  Wt Readings from Last 10 Encounters:  09/18/14 219 lb 2.2 oz (99.4 kg)  09/11/14 224 lb (101.606 kg)  05/26/12 236 lb (107.049 kg)  11/12/11 228 lb (103.42 kg)  10/29/11 224 lb (101.606 kg)  09/29/11 229 lb (103.874 kg)  04/01/11 233 lb (105.688 kg)  03/05/10 226 lb (102.513 kg)  02/16/09 229 lb (103.874 kg)    BMI:  Body mass index is  35.39 kg/(m^2).  Estimated Nutritional Needs:  Kcal:  3833-3832  Protein:  129 gm  Fluid:  1.8 L  Skin:  Reviewed, no issues  Diet Order:   NPO  EDUCATION NEEDS:  No education needs identified at this time   Intake/Output Summary (Last 24 hours) at 09/18/14 1342 Last data filed at 09/18/14 1200  Gross per 24 hour  Intake 2447.18 ml  Output   1650 ml  Net 797.18 ml    Last BM:  5/19   Joaquin Courts, RD, LDN, CNSC Pager [phone] After Hours Pager [phone]

## 2014-09-18 NOTE — Care Management Note (Signed)
Case Management Note  Patient Details  Name: Roger Ashley MRN: [id number] Date of Birth: 07/20/1945  Subjective/Objective:                  Transferred from [facility] - On vent and pressors  Action/Plan: CM will continue to follow for discharge needs.   Expected Discharge Date:                  Expected Discharge Plan:  Home w Home Health Services  In-House Referral:     Discharge planning Services     Post Acute Care Choice:    Choice offered to:     DME Arranged:    DME Agency:     HH Arranged:    HH Agency:     Status of Service:  In process, will continue to follow  Medicare Important Message Given:    Date Medicare IM Given:    Medicare IM give by:    Date Additional Medicare IM Given:    Additional Medicare Important Message give by:     If discussed at Long Length of Stay Meetings, dates discussed:    Additional Comments:  Vangie Bicker, RN 09/18/2014, 8:49 AM

## 2014-09-18 NOTE — Progress Notes (Signed)
PULMONARY / CRITICAL CARE MEDICINE HISTORY AND PHYSICAL EXAMINATION   Name: Roger Ashley MRN: [id number] DOB: [birth year]    ADMISSION DATE:  2014/09/23  PRIMARY SERVICE: PCCM  CHIEF COMPLAINT:  SOB  BRIEF PATIENT DESCRIPTION: 60 M with EF 40-45%, CKD 4, DM, HTN who presented to [facility] with worsening SOB 2/2 ARDS vs aCHF and eventually required intubation. Transferred to [facility] for further management.   SIGNIFICANT EVENTS / STUDIES:  5/20  Admit with respiratory distress, ABG on admission 7.336/36.2/69.2 (on BiPAP).  Intubated, ARDS protocol  5/22  Not synchronous on vent, CXR improved.  ARDS protocol d/c'd, f/u ABG >> 7.15/ 63/79/22.  Nimbex gtt started. Poorly controlled CBGs --> transitioned to resistant SSI  SUBJECTIVE: Per RN breathing better on vent with Nimbex.  ARDS protocol.    VITAL SIGNS: Temp:  [97.4 F (36.3 C)-98.6 F (37 C)] 97.4 F (36.3 C) (05/23 0746) Pulse Rate:  [59-97] 59 (05/23 0700) Resp:  [11-24] 24 (05/23 0700) BP: (96-148)/(37-69) 121/52 mmHg (05/23 0700) SpO2:  [90 %-100 %] 100 % (05/23 0700) FiO2 (%):  [40 %-80 %] 40 % (05/23 0600) Weight:  [219 lb 2.2 oz (99.4 kg)] 219 lb 2.2 oz (99.4 kg) (05/23 0359)   HEMODYNAMICS: CVP:  [8 mmHg-19 mmHg] 8 mmHg   VENTILATOR SETTINGS: Vent Mode:  [-] PRVC FiO2 (%):  [40 %-80 %] 40 % Set Rate:  [20 bmp-24 bmp] 24 bmp Vt Set:  [510 mL] 510 mL PEEP:  [5 cmH20-10 cmH20] 5 cmH20 Plateau Pressure:  [23 cmH20-29 cmH20] 23 cmH20   INTAKE / OUTPUT: Intake/Output      05/22 0701 - 05/23 0700 05/23 0701 - 05/24 0700   I.V. (mL/kg) 2167.8 (21.8)    NG/GT 670    IV Piggyback 175    Total Intake(mL/kg) 3012.8 (30.3)    Urine (mL/kg/hr) 1425 (0.6)    Emesis/NG output     Total Output 1425     Net +1587.8            PHYSICAL EXAMINATION: General:  Older adult M in NAD, Intubated Neuro:  Sedated on vent rass deep and paralyzed HEENT:  Sclera anicteric, conjunctiva pink, MMM, ETT present Neck: Trachea supple and  midline, jvd Cardiovascular:  RRR, no MRG Lungs:  Coarse reduced Abdomen:  S/NT/ND/(+)BS Musculoskeletal: no acute deformities  Skin:  Warm/dry, trace edema   LABS:  CBC  Recent Labs Lab 2014/09/23 1305 09/16/14 0500 09/18/14 0330  WBC 14.0* 14.3* 13.3*  HGB 10.3* 9.6* 8.0*  HCT 32.9* 30.0* 24.4*  PLT 264 227 169   Coag's No results for input(s): APTT, INR in the last 168 hours.   BMET  Recent Labs Lab Sep 23, 2014 1305 09/16/14 0500 09/18/14 0330  NA 143 142 137  K 4.3 4.5 4.1  CL 111 111 106  CO2 19* 17* 19*  BUN 65* 71* 96*  CREATININE 5.02* 5.25* 7.21*  GLUCOSE 266* 207* 239*   Electrolytes  Recent Labs Lab 09-23-2014 1305 09/16/14 0500 09/17/14 0300 09/18/14 0330  CALCIUM 8.7* 8.5*  --  8.0*  MG  --   --  1.9  --   PHOS  --  3.9 6.8*  --    Sepsis Markers  Recent Labs Lab 23-Sep-2014 1617 09/17/14 1100 09/18/14 0330  LATICACIDVEN 1.4  --   --   PROCALCITON  --  62.06 56.05   ABG  Recent Labs Lab 09/17/14 0901 09/17/14 1305 09/18/14 0443  PHART 7.155* 7.301* 7.409  PCO2ART 63.5* 40.1 30.4*  PO2ART  79.0* 177.0* 72.5*   Liver Enzymes  Recent Labs Lab 09/18/14 0330  AST 17  ALT 16*  ALKPHOS 53  BILITOT 0.7  ALBUMIN 2.3*     Cardiac Enzymes  Recent Labs Lab September 29, 2014 1305 09/17/14 1453 09/18/14 0330  TROPONINI 0.03 0.10* 0.08*   Glucose  Recent Labs Lab 09/17/14 1149 09/17/14 1248 09/17/14 1718 09/17/14 2001 09/18/14 0012 09/18/14 0326  GLUCAP 128* 140* 158* 188* 282* 213*    Imaging Dg Chest 1 View  09/16/2014   CLINICAL DATA:  Status post ET tube placement and OG-tube placement.  EXAM: CHEST  1 VIEW  COMPARISON:  09/16/2014  FINDINGS: ET tube terminates in the mid trachea. OG tube courses inferior to the diaphragm, tip not included on this examination. Stable enlarged cardiac and mediastinal contours. Unchanged diffuse bilateral airspace opacities. Bilateral glenohumeral joint degenerative change.  IMPRESSION: ET tube  terminates in the mid trachea.  OG tube is difficult to visualized however appears to course inferior to the diaphragm, tip not included on this evaluation.  No significant interval change diffuse bilateral airspace opacities potentially secondary to multi focal pneumonia, edema or hemorrhage.   Electronically Signed   By: Annia Belt M.D.   On: 09/16/2014 18:07   Dg Chest Port 1 View  09/18/2014   CLINICAL DATA:  Respiratory failure.  Shortness of breath.  EXAM: PORTABLE CHEST - 1 VIEW  COMPARISON:  09/17/2014.  FINDINGS: Endotracheal tube, NG tube, left IJ line in stable position. Cardiomegaly with bilateral pulmonary alveolar infiltrates, increased in the lung bases. Low lung volumes with basilar atelectasis. Small left pleural effusion cannot be excluded. No pneumothorax.  IMPRESSION: 1. Lines and tubes in stable position. 2. Persistent cardiomegaly and bilateral pulmonary infiltrates suggesting congestive heart failure. Bilateral pneumonia cannot be excluded. Low lung volumes with basilar atelectasis.   Electronically Signed   By: Maisie Fus  Register   On: 09/18/2014 07:29   Dg Chest Port 1 View  09/17/2014   CLINICAL DATA:  Shortness of breath.  PICC placement.  EXAM: PORTABLE CHEST - 1 VIEW  COMPARISON:  One day prior at 1725 hour  FINDINGS: The enteric tube is 7.4 cm from the carina. Enteric tube in place, tip below the diaphragm not included in the field of view. Left internal jugular central venous catheter tip in the mid SVC. No definite additional upper extremity catheters are seen. No pneumothorax. There is improving perihilar aeration with persistent diffuse parenchymal opacities. Borderline cardiomegaly.  IMPRESSION: 1. Tip of the left internal jugular central line in the mid SVC. No pneumothorax. 2. Improving lung aeration with decreasing parenchymal opacities.   Electronically Signed   By: Rubye Oaks M.D.   On: 09/17/2014 04:36   Dg Chest Port 1 View  09/16/2014   CLINICAL DATA:   69 year old male with respiratory failure  EXAM: PORTABLE CHEST - 1 VIEW  COMPARISON:  Prior chest x-ray 09-29-14  FINDINGS: Persistent diffuse bilateral interstitial and airspace opacities in a batwing distribution most consistent with moderate to severe pulmonary edema. The degree of cardiomegaly is unchanged. No large effusion or pneumothorax. Bilateral glenohumeral joint osteoarthritis. No acute osseous abnormality.  IMPRESSION: Unchanged chest x-ray which remains most consistent with moderate to severe pulmonary edema. Other, less likely considerations include extensive multifocal pneumonia, and diffuse alveolar hemorrhage.   Electronically Signed   By: Malachy Moan M.D.   On: 09/16/2014 11:27   Dg Abd Portable 1v  09/17/2014   CLINICAL DATA:  OG tube placement  EXAM: PORTABLE ABDOMEN -  1 VIEW  COMPARISON:  None.  FINDINGS: Enteric tube terminates in the region of the distal gastric antrum/proximal duodenum.  Nonobstructive bowel gas pattern.  IMPRESSION: Enteric tube terminates in the region of the distal gastric antrum/proximal duodenum.   Electronically Signed   By: Charline Bills M.D.   On: 09/17/2014 13:45    ASSESSMENT / PLAN:  PULMONARY A: Acute hypoxemic respiratory failure - DDx includes ARDS vs acute heart failure.  BNP 547 on admit.  Diffuse Bilateral Airspace Disease  ARDS P:   Adjust vent settings.   ABg reviewed, with poor exhalation yesterday, will reduce rate  Hope to reduce sedation / dc paralysis to perform sbt Neg balance on hold with crt rise and cvp 8   CARDIOVASCULAR L IJ TLC 5/20 >>  A: Chronic Systolic HF - Unclear why on both CCB and BB. Per chart review most care at [facility].  HTN - Currently not hypertensive likely 2/2 propofol P:   Hold Metoprolol, Nifepidine & hydralazine ICU monitoring  Net even goal Levophed for MAP > 60, improved ASA   RENAL A: AKI on CKD - Likely 2/2 aggressive diuresis.  Ur cr 168, Ur Na 27 P:   Trend BMP / UOP  Replace  electrolytes as indicated  Allow pos balance, follow trend crt ensure renal US, UA, urine osm, na - repeats Saline to 50 cc/hr  GASTROINTESTINAL A: Vent Associated Dysphagia P:  Begin TF  PPI  Follow last BM Add colace, mirilax  HEMATOLOGIC A: Anemia - Likely AOCD P:   Monitor CBC Heparin for DVT prophylaxis   INFECTIOUS Sputum Culture 5/21 >> Blood Culture x 2 5/20 >> Strep Pneumo 5/22 >> neg  A: HCAP vs CAP P:   Broaden ABx to Vanc, Cefepime, and azithro given unclear medical backround Vanco, start date 5/21>>> Cefepime, start date 5/21>>>  Azithro, start date 5/20>>>  Narrow off vanc, cefepime Add ceftriaxone Add stop date azithro 25th Send urine leg  PCT protocol reviewed  ENDOCRINE A: DM P:   Hold glipizide Resistant SSI lantus addition 5  NEUROLOGIC A: ICU Associated Discomfort P:  Monitor neuro status  Propofol for sedation  Fentanyl gtt for pain  Nimbex dc Rate reduction then observe off nimbex   FAMILY: updated family 5/23   Genelle Gather, MD Internal Medicine Resident, PGY III [facility] 09/18/2014 8:06 AM   STAFF NOTE: Cindi Carbon, MD FACP have personally reviewed patient's available data, including medical history, events of note, physical examination and test results as part of my evaluation. I have discussed with resident/NP and other care providers such as pharmacist, RN and RRT. In addition, I personally evaluated patient and elicited key findings of: Improved BS, improved pcxr, cvp 8, likley over diuresisis with crt rise, allow pos balance, chem in am , renal called, send renal US, narrow down ABX, dc paralysis , hope to wean in afternooon or am , follow vent dyschrony issues? The patient is critically ill with multiple organ systems failure and requires high complexity decision making for assessment and support, frequent evaluation and titration of therapies, application of advanced monitoring  technologies and extensive interpretation of multiple databases.   Critical Care Time devoted to patient care services described in this note is35 Minutes. This time reflects time of care of this signee: Rory Percy, MD FACP. This critical care time does not reflect procedure time, or teaching time or supervisory time of PA/NP/Med student/Med Resident etc but could involve care discussion time.  Rest per NP/medical resident whose note is outlined above and that I agree with   Mcarthur Rossetti. Tyson Alias, MD, FACP Pgr: [phone] [facility] 09/18/2014 10:04 AM

## 2014-09-19 ENCOUNTER — Inpatient Hospital Stay (HOSPITAL_COMMUNITY): Payer: Commercial Managed Care - HMO

## 2014-09-19 ENCOUNTER — Encounter (HOSPITAL_COMMUNITY): Payer: Self-pay

## 2014-09-19 DIAGNOSIS — R4182 Altered mental status, unspecified: Secondary | ICD-10-CM

## 2014-09-19 DIAGNOSIS — J189 Pneumonia, unspecified organism: Secondary | ICD-10-CM | POA: Insufficient documentation

## 2014-09-19 LAB — BASIC METABOLIC PANEL
Anion gap: 13 (ref 5–15)
BUN: 103 mg/dL — ABNORMAL HIGH (ref 6–20)
CO2: 20 mmol/L — AB (ref 22–32)
Calcium: 8.4 mg/dL — ABNORMAL LOW (ref 8.9–10.3)
Chloride: 110 mmol/L (ref 101–111)
Creatinine, Ser: 6.59 mg/dL — ABNORMAL HIGH (ref 0.61–1.24)
GFR calc Af Amer: 9 mL/min — ABNORMAL LOW (ref >60)
GFR calc non Af Amer: 8 mL/min — ABNORMAL LOW (ref >60)
Glucose, Bld: 147 mg/dL — ABNORMAL HIGH (ref 65–99)
Potassium: 4.4 mmol/L (ref 3.5–5.1)
Sodium: 143 mmol/L (ref 135–145)

## 2014-09-19 LAB — CBC
HCT: 25.5 % — ABNORMAL LOW (ref 39.0–52.0)
Hemoglobin: 8.6 g/dL — ABNORMAL LOW (ref 13.0–17.0)
MCH: 28.8 pg (ref 26.0–34.0)
MCHC: 33.7 g/dL (ref 30.0–36.0)
MCV: 85.3 fL (ref 78.0–100.0)
PLATELETS: 197 10*3/uL (ref 150–400)
RBC: 2.99 MIL/uL — AB (ref 4.22–5.81)
RDW: 14 % (ref 11.5–15.5)
WBC: 9.3 10*3/uL (ref 4.0–10.5)

## 2014-09-19 LAB — LEGIONELLA ANTIGEN, URINE

## 2014-09-19 LAB — GLUCOSE, CAPILLARY
GLUCOSE-CAPILLARY: 189 mg/dL — AB (ref 65–99)
GLUCOSE-CAPILLARY: 159 mg/dL — AB (ref 65–99)
Glucose-Capillary: 142 mg/dL — ABNORMAL HIGH (ref 65–99)
Glucose-Capillary: 161 mg/dL — ABNORMAL HIGH (ref 65–99)
Glucose-Capillary: 178 mg/dL — ABNORMAL HIGH (ref 65–99)
Glucose-Capillary: 133 mg/dL — ABNORMAL HIGH (ref 65–99)

## 2014-09-19 LAB — PROCALCITONIN: Procalcitonin: 45.06 ng/mL

## 2014-09-19 MED ORDER — SODIUM CHLORIDE 0.45 % IV SOLN
INTRAVENOUS | Status: DC
Start: 2014-09-19 — End: 2014-09-21
  Administered 2014-09-19 – 2014-09-20 (×2): via INTRAVENOUS

## 2014-09-19 NOTE — Progress Notes (Signed)
PULMONARY / CRITICAL CARE MEDICINE HISTORY AND PHYSICAL EXAMINATION   Name: Roger Ashley MRN: [id number] DOB: [birth year]    ADMISSION DATE:  09/19/2014  PRIMARY SERVICE: PCCM  CHIEF COMPLAINT:  SOB  BRIEF PATIENT DESCRIPTION: 64 M with EF 40-45%, CKD 4, DM, HTN who presented to [facility] with worsening SOB 2/2 ARDS vs aCHF and eventually required intubation. Transferred to [facility] for further management.   SIGNIFICANT EVENTS / STUDIES:  5/20: Admit with respiratory distress, ABG on admission 7.336/36.2/69.2 (on BiPAP).  Intubated, ARDS protocol  5/22: Not synchronous on vent, CXR improved.  ARDS protocol d/c'd, f/u ABG >> 7.15/ 63/79/22.  Nimbex gtt started. Poorly controlled CBGs --> transitioned to resistant SSI 5/23: Off Nimbex and Levo during the day, Levo restarted overnight briefly. Pt able to open eyes to name.  SUBJECTIVE: Per RN off Nimbex and Levo and doing well.  Able to open eyes to name 5/23.    VITAL SIGNS: Temp:  [97.4 F (36.3 C)-97.7 F (36.5 C)] 97.5 F (36.4 C) (05/24 0737) Pulse Rate:  [57-102] 66 (05/24 0700) Resp:  [17-24] 20 (05/24 0700) BP: (88-158)/(34-61) 114/39 mmHg (05/24 0700) SpO2:  [97 %-100 %] 100 % (05/24 0700) FiO2 (%):  [40 %] 40 % (05/24 0600) Weight:  [217 lb 13 oz (98.8 kg)] 217 lb 13 oz (98.8 kg) (05/24 0400)   HEMODYNAMICS: CVP:  [7 mmHg-10 mmHg] 7 mmHg   VENTILATOR SETTINGS: Vent Mode:  [-] PRVC FiO2 (%):  [40 %] 40 % Set Rate:  [20 bmp-24 bmp] 20 bmp Vt Set:  [510 mL] 510 mL PEEP:  [5 cmH20] 5 cmH20 Plateau Pressure:  [24 cmH20-26 cmH20] 26 cmH20   INTAKE / OUTPUT: Intake/Output      05/23 0701 - 05/24 0700 05/24 0701 - 05/25 0700   I.V. (mL/kg) 2120.2 (21.5)    NG/GT 525    IV Piggyback 125    Total Intake(mL/kg) 2770.2 (28)    Urine (mL/kg/hr) 1600 (0.7)    Total Output 1600     Net +1170.2            PHYSICAL EXAMINATION: General: NAD, Intubated HEENT: OETT in place Neck: Trachea supple and midline, no jvd  appreciable Cardiovascular:  RRR, no MRG Lungs: Clear bilaterally Abdomen: S, NT, ND Musculoskeletal: No acute deformities  Skin: Warm/dry, no appreciable edema  Neuro: Sedated on vent  LABS:  CBC  Recent Labs Lab 09/16/14 0500 09/18/14 0330 09/19/14 0353  WBC 14.3* 13.3* 9.3  HGB 9.6* 8.0* 8.6*  HCT 30.0* 24.4* 25.5*  PLT 227 169 197   Coag's No results for input(s): APTT, INR in the last 168 hours.   BMET  Recent Labs Lab 09/24/2014 1305 09/16/14 0500 09/18/14 0330  NA 143 142 137  K 4.3 4.5 4.1  CL 111 111 106  CO2 19* 17* 19*  BUN 65* 71* 96*  CREATININE 5.02* 5.25* 7.21*  GLUCOSE 266* 207* 239*   Electrolytes  Recent Labs Lab 09/01/2014 1305 09/16/14 0500 09/17/14 0300 09/18/14 0330  CALCIUM 8.7* 8.5*  --  8.0*  MG  --   --  1.9  --   PHOS  --  3.9 6.8*  --    Sepsis Markers  Recent Labs Lab 08/29/2014 1617 09/17/14 1100 09/18/14 0330 09/19/14 0353  LATICACIDVEN 1.4  --   --   --   PROCALCITON  --  62.06 56.05 45.06   ABG  Recent Labs Lab 09/17/14 0901 09/17/14 1305 09/18/14 0443  PHART 7.155* 7.301* 7.409  PCO2ART 63.5* 40.1 30.4*  PO2ART 79.0* 177.0* 72.5*   Liver Enzymes  Recent Labs Lab 09/18/14 0330  AST 17  ALT 16*  ALKPHOS 53  BILITOT 0.7  ALBUMIN 2.3*     Cardiac Enzymes  Recent Labs Lab 09/03/2014 1305 09/17/14 1453 09/18/14 0330  TROPONINI 0.03 0.10* 0.08*   Glucose  Recent Labs Lab 09/18/14 0744 09/18/14 1122 09/18/14 1542 09/18/14 1953 09/18/14 2325 09/19/14 0317  GLUCAP 226* 235* 176* 128* 189* 159*    Imaging US Renal  09/18/2014   CLINICAL DATA:  Acute kidney injury.  Initial encounter.  EXAM: RENAL / URINARY TRACT ULTRASOUND COMPLETE  COMPARISON:  None.  FINDINGS: Study was performed portably in the ICU.  Right Kidney:  Length: 10.0 cm. There is renal cortical thinning and increased echogenicity. No hydronephrosis or focal cortical lesion identified.  Left Kidney:  Length: 10.3 cm. There is  renal cortical thinning and increased echogenicity. No hydronephrosis or focal cortical lesion identified.  Bladder:  Decompressed by Foley catheter.  IMPRESSION: 1. Both kidneys demonstrate cortical thinning and increased echogenicity consistent with chronic medical renal disease. 2. No evidence of hydronephrosis.  Decompressed bladder.   Electronically Signed   By: Carey Bullocks M.D.   On: 09/18/2014 18:20   Dg Chest Port 1 View  09/19/2014   CLINICAL DATA:  Pneumonia  EXAM: PORTABLE CHEST - 1 VIEW  COMPARISON:  09/18/2014  FINDINGS: Endotracheal tube tip in good position between the clavicular heads and carina. There is a left IJ central line, tip at the SVC level. Orogastric tube, tip at least in the stomach.  Bibasilar consolidation, greater on the left. No edema, effusion, or pneumothorax.  IMPRESSION: 1. Tubes and central line remain in good position. 2. Stable bibasilar pneumonia.   Electronically Signed   By: Marnee Spring M.D.   On: 09/19/2014 06:59   Dg Chest Port 1 View  09/18/2014   CLINICAL DATA:  Respiratory failure.  Shortness of breath.  EXAM: PORTABLE CHEST - 1 VIEW  COMPARISON:  09/17/2014.  FINDINGS: Endotracheal tube, NG tube, left IJ line in stable position. Cardiomegaly with bilateral pulmonary alveolar infiltrates, increased in the lung bases. Low lung volumes with basilar atelectasis. Small left pleural effusion cannot be excluded. No pneumothorax.  IMPRESSION: 1. Lines and tubes in stable position. 2. Persistent cardiomegaly and bilateral pulmonary infiltrates suggesting congestive heart failure. Bilateral pneumonia cannot be excluded. Low lung volumes with basilar atelectasis.   Electronically Signed   By: Maisie Fus  Register   On: 09/18/2014 07:29   Dg Abd Portable 1v  09/17/2014   CLINICAL DATA:  OG tube placement  EXAM: PORTABLE ABDOMEN - 1 VIEW  COMPARISON:  None.  FINDINGS: Enteric tube terminates in the region of the distal gastric antrum/proximal duodenum.  Nonobstructive  bowel gas pattern.  IMPRESSION: Enteric tube terminates in the region of the distal gastric antrum/proximal duodenum.   Electronically Signed   By: Charline Bills M.D.   On: 09/17/2014 13:45    ASSESSMENT / PLAN:  PULMONARY A: Acute hypoxemic respiratory failure - DDx includes ARDS vs acute heart failure.  BNP 547 on admit.  Diffuse Bilateral Airspace Disease  ARDS P:   ABG reviewed, consider reduction MV Rate 16 SBt today, cpap5 ps5-10 as able pcxr in am  Some low RR failed wean   CARDIOVASCULAR L IJ TLC 5/20 >>  A: Chronic Systolic HF - Unclear why on both CCB and BB. Per chart review most care at [facility]  H/o HTN  Hypotension overnight req Levo P:   Holding Metoprolol, Nifepidine & hydralazine ICU monitoring  Net even goal to pos as crt improved Levophed for MAP > 60, off ASA   RENAL A: AKI on CKD - Likely 2/2 aggressive diuresis.  Ur cr 168, Ur Na 27 --> 107/31 on 5/23 uop stable, -1.6L in 24hrs UA  P:   Trend BMP / UOP  Replace electrolytes as indicated, BMP pending Urine osmols pending Keeping even Ensure renal US, UA, urine osm, na - repeats IVF: NS@50  cc/hr, change to 1/2 NS as cl rising  GASTROINTESTINAL A: Vent Associated Dysphagia P:  Continue TF: Oxepa PPI  Follow last BM Bowel regimen: Colace, Mirilax  HEMATOLOGIC A: Anemia - Likely AOCD Hgb stable, 8.6 P:   Monitor CBC DVT PPx: [place] Heparin  INFECTIOUS Sputum Culture 5/21 >> Blood Culture x 2 5/20 >> Strep Pneumo 5/22 >> neg  pCXR improved A: HCAP vs CAP --> initially on broad coverage due to unclear medical background Narrowed off Vanc and Cefepime 5/23 P:   Vanco, start date 5/21>>>5/23 Cefepime, start date 5/21>>>5/23  Azithro, start date 5/20>>> stop date 5/25 Ceftriaxone, start date 5/23>>  Urine Legionella Ag pending PCT protocol  Establish stop date ceftriaxone, stop 29th  ENDOCRINE A: DM Improved CBGs with resistant SSI and addition of Lantus P:   Holding glipizide   Resistant SSI Continue Lantus 5u  NEUROLOGIC A: ICU Associated Discomfort P:  Monitor neuro status  Propofol for sedation  WUA Fentanyl gtt for pain  Nimbex off   FAMILY: Family updated by Dr. Tyson Alias 5/23, 24th   Genelle Gather, MD Internal Medicine Resident, PGY III [facility] 09/19/2014 7:45 AM    STAFF NOTE: Cindi Carbon, MD FACP have personally reviewed patient's available data, including medical history, events of note, physical examination and test results as part of my evaluation. I have discussed with resident/NP and other care providers such as pharmacist, RN and RRT. In addition, I personally evaluated patient and elicited key findings of: Improved exam and pcxr, crt better with pos balance, allow pos balance, off a lot of se3dation, prop of if needed, off paralysis, renal US neg, wean sbt today, happy with progress today The patient is critically ill with multiple organ systems failure and requires high complexity decision making for assessment and support, frequent evaluation and titration of therapies, application of advanced monitoring technologies and extensive interpretation of multiple databases.   Critical Care Time devoted to patient care services described in this note is30 Minutes. This time reflects time of care of this signee: Rory Percy, MD FACP. This critical care time does not reflect procedure time, or teaching time or supervisory time of PA/NP/Med student/Med Resident etc but could involve care discussion time. Rest per NP/medical resident whose note is outlined above and that I agree with   Mcarthur Rossetti. Tyson Alias, MD, FACP Pgr: [phone] [facility] 09/19/2014 2:28 PM

## 2014-09-19 NOTE — Progress Notes (Signed)
EEG completed, results pending. 

## 2014-09-19 NOTE — Procedures (Signed)
History: 69 yo M with altered mental status  Sedation: None  Technique: This is a 19 channel routine scalp EEG performed at the bedside with bipolar and monopolar montages arranged in accordance to the international 10/20 system of electrode placement. One channel was dedicated to EKG recording.    Background: The background consists of generalized irregular delta activity with a paucity of faster frequencies. There is no posterior dominant rhythm visualized.  Photic stimulation: Physiologic driving is not performed  EEG Abnormalities: 1) Generalized slow activity  Clinical Interpretation: This EEG is consistent with a moderate - severe generalized non-specific cerebral dysfunction(encephalopathy). There was no seizure or seizure predisposition recorded on this study.   Ritta Slot, MD [facility] [phone]  If 7pm- 7am, please page neurology on call as listed in AMION.

## 2014-09-20 ENCOUNTER — Inpatient Hospital Stay (HOSPITAL_COMMUNITY): Payer: Commercial Managed Care - HMO

## 2014-09-20 ENCOUNTER — Telehealth: Payer: Self-pay | Admitting: Cardiology

## 2014-09-20 LAB — CULTURE, BLOOD (ROUTINE X 2)
Culture: NO GROWTH
Culture: NO GROWTH

## 2014-09-20 LAB — GLUCOSE, CAPILLARY
GLUCOSE-CAPILLARY: 116 mg/dL — AB (ref 65–99)
GLUCOSE-CAPILLARY: 117 mg/dL — AB (ref 65–99)
GLUCOSE-CAPILLARY: 234 mg/dL — AB (ref 65–99)
GLUCOSE-CAPILLARY: 192 mg/dL — AB (ref 65–99)
Glucose-Capillary: 136 mg/dL — ABNORMAL HIGH (ref 65–99)
Glucose-Capillary: 222 mg/dL — ABNORMAL HIGH (ref 65–99)

## 2014-09-20 LAB — BASIC METABOLIC PANEL
Anion gap: 12 (ref 5–15)
Anion gap: 14 (ref 5–15)
BUN: 114 mg/dL — ABNORMAL HIGH (ref 6–20)
BUN: 135 mg/dL — ABNORMAL HIGH (ref 6–20)
CALCIUM: 8.8 mg/dL — AB (ref 8.9–10.3)
CALCIUM: 8.5 mg/dL — AB (ref 8.9–10.3)
CO2: 21 mmol/L — AB (ref 22–32)
CO2: 18 mmol/L — ABNORMAL LOW (ref 22–32)
Chloride: 111 mmol/L (ref 101–111)
Chloride: 110 mmol/L (ref 101–111)
Creatinine, Ser: 6.79 mg/dL — ABNORMAL HIGH (ref 0.61–1.24)
Creatinine, Ser: 6.89 mg/dL — ABNORMAL HIGH (ref 0.61–1.24)
GFR calc Af Amer: 9 mL/min — ABNORMAL LOW (ref >60)
GFR calc non Af Amer: 8 mL/min — ABNORMAL LOW (ref >60)
GFR, EST AFRICAN AMERICAN: 9 mL/min — AB (ref >60)
GFR, EST NON AFRICAN AMERICAN: 7 mL/min — AB (ref >60)
Glucose, Bld: 158 mg/dL — ABNORMAL HIGH (ref 65–99)
Glucose, Bld: 239 mg/dL — ABNORMAL HIGH (ref 65–99)
POTASSIUM: 5.3 mmol/L — AB (ref 3.5–5.1)
Potassium: 5 mmol/L (ref 3.5–5.1)
SODIUM: 144 mmol/L (ref 135–145)
Sodium: 142 mmol/L (ref 135–145)

## 2014-09-20 LAB — CBC
HCT: 26.6 % — ABNORMAL LOW (ref 39.0–52.0)
Hemoglobin: 8.1 g/dL — ABNORMAL LOW (ref 13.0–17.0)
MCH: 26.6 pg (ref 26.0–34.0)
MCHC: 30.5 g/dL (ref 30.0–36.0)
MCV: 87.2 fL (ref 78.0–100.0)
PLATELETS: 215 10*3/uL (ref 150–400)
RBC: 3.05 MIL/uL — ABNORMAL LOW (ref 4.22–5.81)
RDW: 14.4 % (ref 11.5–15.5)
WBC: 6.4 10*3/uL (ref 4.0–10.5)

## 2014-09-20 LAB — OSMOLALITY, URINE: Osmolality, Ur: 347 mOsm/kg — ABNORMAL LOW (ref 390–1090)

## 2014-09-20 MED ORDER — FUROSEMIDE 10 MG/ML IJ SOLN
160.0000 mg | Freq: Once | INTRAVENOUS | Status: AC
  Administered 2014-09-20: 160 mg via INTRAVENOUS
  Filled 2014-09-20: qty 16

## 2014-09-20 MED ORDER — LABETALOL HCL 5 MG/ML IV SOLN
10.0000 mg | INTRAVENOUS | Status: DC | PRN
  Administered 2014-09-20 – 2014-09-21 (×3): 10 mg via INTRAVENOUS
  Filled 2014-09-20 (×3): qty 4

## 2014-09-20 MED ORDER — METOPROLOL TARTRATE 100 MG PO TABS
100.0000 mg | ORAL_TABLET | Freq: Two times a day (BID) | ORAL | Status: DC
  Filled 2014-09-20: qty 1

## 2014-09-20 MED ORDER — LABETALOL HCL 100 MG PO TABS
100.0000 mg | ORAL_TABLET | Freq: Two times a day (BID) | ORAL | Status: DC
  Administered 2014-09-20: 100 mg via ORAL
  Filled 2014-09-20 (×3): qty 1

## 2014-09-20 MED ORDER — LABETALOL HCL 5 MG/ML IV SOLN: 10.0000 mg | INTRAVENOUS | Status: DC | PRN

## 2014-09-20 MED ORDER — LABETALOL HCL 5 MG/ML IV SOLN
10.0000 mg | Freq: Once | INTRAVENOUS | Status: AC
  Administered 2014-09-20: 10 mg via INTRAVENOUS
  Filled 2014-09-20: qty 4

## 2014-09-20 MED ORDER — BISACODYL 10 MG RE SUPP
10.0000 mg | Freq: Once | RECTAL | Status: AC
  Administered 2014-09-20: 10 mg via RECTAL
  Filled 2014-09-20: qty 1

## 2014-09-20 NOTE — Telephone Encounter (Signed)
Results of echo per patient's wife  tg

## 2014-09-20 NOTE — Progress Notes (Signed)
PULMONARY / CRITICAL CARE MEDICINE HISTORY AND PHYSICAL EXAMINATION   Name: Roger Ashley MRN: [id number] DOB: Aug 08, 1945    ADMISSION DATE:  2014/10/02  PRIMARY SERVICE: PCCM  CHIEF COMPLAINT:  SOB  BRIEF PATIENT DESCRIPTION: 17 M with EF 40-45%, CKD 4, DM, HTN who presented to [facility] with worsening SOB 2/2 ARDS vs aCHF and eventually required intubation. Transferred to [facility] for further management.   SIGNIFICANT EVENTS / STUDIES:  5/20: Admit with respiratory distress, ABG on admission 7.336/36.2/69.2 (on BiPAP).  Intubated, ARDS protocol  5/22: Not synchronous on vent, CXR improved.  ARDS protocol d/c'd, f/u ABG >> 7.15/ 63/79/22.  Nimbex gtt started. Poorly controlled CBGs --> transitioned to resistant SSI 5/23: Off Nimbex and Levo during the day, Levo restarted overnight briefly. Pt able to open eyes to name.  SUBJECTIVE: pos 500 cc, remained off paralysis, reducign prop  VITAL SIGNS: Temp:  [97.8 F (36.6 C)-98.6 F (37 C)] 97.8 F (36.6 C) (05/25 0725) Pulse Rate:  [80-116] 94 (05/25 0725) Resp:  [6-21] 15 (05/25 0725) BP: (109-182)/(55-93) 122/67 mmHg (05/25 0725) SpO2:  [99 %-100 %] 100 % (05/25 0725) FiO2 (%):  [40 %] 40 % (05/25 0725) Weight:  [98.8 kg (217 lb 13 oz)] 98.8 kg (217 lb 13 oz) (05/25 0339)   HEMODYNAMICS:     VENTILATOR SETTINGS: Vent Mode:  [-] PRVC FiO2 (%):  [40 %] 40 % Set Rate:  [15 bmp-20 bmp] 15 bmp Vt Set:  [510 mL] 510 mL PEEP:  [5 cmH20] 5 cmH20 Pressure Support:  [10 cmH20] 10 cmH20 Plateau Pressure:  [23 cmH20-31 cmH20] 31 cmH20   INTAKE / OUTPUT: Intake/Output      05/24 0701 - 05/25 0700 05/25 0701 - 05/26 0700   I.V. (mL/kg) 1374.4 (13.9)    NG/GT 290    IV Piggyback 175    Total Intake(mL/kg) 1839.4 (18.6)    Urine (mL/kg/hr) 1275 (0.5)    Total Output 1275     Net +564.4            PHYSICAL EXAMINATION: General: NAD, Intubated HEENT: OETT in place, jvd down Neck: Trachea supple and midline Cardiovascular:  RRR, no  MRG Lungs: Cta, some breath stack Abdomen: S, NT, ND Musculoskeletal: No acute deformities  Skin: Warm/dry, no appreciable edema  Neuro: Sedated on vent rass -3  LABS:  CBC  Recent Labs Lab 09/18/14 0330 09/19/14 0353 09/20/14 0350  WBC 13.3* 9.3 6.4  HGB 8.0* 8.6* 8.1*  HCT 24.4* 25.5* 26.6*  PLT 169 197 215   Coag's No results for input(s): APTT, INR in the last 168 hours.   BMET  Recent Labs Lab 09/18/14 0330 09/19/14 0815 09/20/14  NA 137 143 144  K 4.1 4.4 5.0  CL 106 110 111  CO2 19* 20* 21*  BUN 96* 103* 114*  CREATININE 7.21* 6.59* 6.79*  GLUCOSE 239* 147* 158*   Electrolytes  Recent Labs Lab 09/16/14 0500 09/17/14 0300 09/18/14 0330 09/19/14 0815 09/20/14  CALCIUM 8.5*  --  8.0* 8.4* 8.5*  MG  --  1.9  --   --   --   PHOS 3.9 6.8*  --   --   --    Sepsis Markers  Recent Labs Lab 2014-10-02 1617 09/17/14 1100 09/18/14 0330 09/19/14 0353  LATICACIDVEN 1.4  --   --   --   PROCALCITON  --  62.06 56.05 45.06   ABG  Recent Labs Lab 09/17/14 0901 09/17/14 1305 09/18/14 0443  PHART 7.155* 7.301*  7.409  PCO2ART 63.5* 40.1 30.4*  PO2ART 79.0* 177.0* 72.5*   Liver Enzymes  Recent Labs Lab 09/18/14 0330  AST 17  ALT 16*  ALKPHOS 53  BILITOT 0.7  ALBUMIN 2.3*     Cardiac Enzymes  Recent Labs Lab 09/19/2014 1305 09/17/14 1453 09/18/14 0330  TROPONINI 0.03 0.10* 0.08*   Glucose  Recent Labs Lab 09/19/14 0735 09/19/14 1127 09/19/14 1556 09/19/14 2011 09/20/14 0020 09/20/14 0438  GLUCAP 142* 161* 178* 133* 136* 116*    Imaging Ct Head Wo Contrast  09/19/2014   CLINICAL DATA:  Altered mental status.  Patient is unresponsive.  EXAM: CT HEAD WITHOUT CONTRAST  TECHNIQUE: Contiguous axial images were obtained from the base of the skull through the vertex without intravenous contrast.  COMPARISON:  None.  FINDINGS: Skull and Sinuses:No fracture or destructive process. There is partial bilateral mastoid opacification which is  likely related to nasopharyngeal fluid in the setting of intubation.  Orbits: No acute abnormality.  Brain: No evidence of acute infarction, hemorrhage, hydrocephalus, or mass lesion/mass effect. There is symmetric gray-Zabriskie differentiation, suboptimally evaluated due to technical limitations. There is patchy bilateral cerebral [place] matter low density compatible with mild chronic small vessel ischemia.  IMPRESSION: 1. No acute findings. 2. Mild small vessel disease. 3. Bilateral mastoid opacification, likely from intubation.   Electronically Signed   By: Marnee Spring M.D.   On: 09/19/2014 23:29   US Renal  09/18/2014   CLINICAL DATA:  Acute kidney injury.  Initial encounter.  EXAM: RENAL / URINARY TRACT ULTRASOUND COMPLETE  COMPARISON:  None.  FINDINGS: Study was performed portably in the ICU.  Right Kidney:  Length: 10.0 cm. There is renal cortical thinning and increased echogenicity. No hydronephrosis or focal cortical lesion identified.  Left Kidney:  Length: 10.3 cm. There is renal cortical thinning and increased echogenicity. No hydronephrosis or focal cortical lesion identified.  Bladder:  Decompressed by Foley catheter.  IMPRESSION: 1. Both kidneys demonstrate cortical thinning and increased echogenicity consistent with chronic medical renal disease. 2. No evidence of hydronephrosis.  Decompressed bladder.   Electronically Signed   By: Carey Bullocks M.D.   On: 09/18/2014 18:20   Dg Chest Port 1 View  09/20/2014   CLINICAL DATA:  Shortness of breath, pneumonia.  EXAM: PORTABLE CHEST - 1 VIEW  COMPARISON:  09/19/2014.  FINDINGS: Endotracheal tube terminates 4.3 cm above the carina. Nasogastric tube is followed into the stomach. Left IJ central line tip projects over the SVC. Heart size stable. Patchy bibasilar airspace opacification persists, left greater than right. No definite pleural fluid.  IMPRESSION: Patchy bibasilar airspace opacification persists, left greater than right, suspicious for  pneumonia.   Electronically Signed   By: Leanna Battles M.D.   On: 09/20/2014 07:21   Dg Chest Port 1 View  09/19/2014   CLINICAL DATA:  Pneumonia  EXAM: PORTABLE CHEST - 1 VIEW  COMPARISON:  09/18/2014  FINDINGS: Endotracheal tube tip in good position between the clavicular heads and carina. There is a left IJ central line, tip at the SVC level. Orogastric tube, tip at least in the stomach.  Bibasilar consolidation, greater on the left. No edema, effusion, or pneumothorax.  IMPRESSION: 1. Tubes and central line remain in good position. 2. Stable bibasilar pneumonia.   Electronically Signed   By: Marnee Spring M.D.   On: 09/19/2014 06:59    ASSESSMENT / PLAN:  PULMONARY A: Acute hypoxemic respiratory failure - DDx includes ARDS vs acute heart failure.  BNP  547 on admit.  Diffuse Bilateral Airspace Disease  ARDS P:   Remains on min settings Prop reduction, wean cpap5 ps 5, goal 2 hr, assess rsbi, abg pcxr noted No sig edema, allowing pos balance  CARDIOVASCULAR L IJ TLC 5/20 >>  A: Chronic Systolic HF - Unclear why on both CCB and BB. Per chart review most care at [facility]  H/o HTN  Hypotension overnight req Levo P:   May need to add home BB  ICU monitoring  ASA   RENAL A: AKI on CKD - Likely 2/2 aggressive diuresis.  ATN uop lower UA  P:   Allow pos balance further May need some form of renal replacement, but not overloaded, k wnl Urine output lower atn driving, have allowed pos balance, - lasix attempt May need metalazone bmet q12h  GASTROINTESTINAL A: Vent Associated Dysphagia P:  Continue TF: Oxepa PPI  Bowel regimen: Colace, Mirilax May need dulx supp  HEMATOLOGIC A: Anemia - Likely AOCD Hgb stable, 8.6 P:   Monitor CBC DVT PPx: [place] Heparin  INFECTIOUS Sputum Culture 5/21 >> Blood Culture x 2 5/20 >> Strep Pneumo 5/22 >> neg  pCXR improved A: HCAP vs CAP --> initially on broad coverage due to unclear medical background Narrowed off Vanc and Cefepime  5/23 P:   Vanco, start date 5/21>>>5/23 Cefepime, start date 5/21>>>5/23  Azithro, start date 5/20>>> stop date 5/25 Ceftriaxone, start date 5/23>>  Urine Legionella Ag - neg Establish stop date ceftriaxone, stop 29th  ENDOCRINE A: DM Improved CBGs with resistant SSI and addition of Lantus P:   Resistant SSI Continue Lantus 5u, keep  NEUROLOGIC A: ICU Associated Discomfort delirium  P:  Monitor neuro status  Propofol for sedation  WUA Fentanyl gtt for pain  Need to lower prop, may need Risperdal    FAMILY: Family updated by Dr. Tyson Alias 5/23, 24th  Ccm tim 30 min   Mcarthur Rossetti. Tyson Alias, MD, FACP Pgr: [phone] [place] [facility] 09/20/2014 8:40 AM

## 2014-09-21 ENCOUNTER — Inpatient Hospital Stay (HOSPITAL_COMMUNITY): Payer: Commercial Managed Care - HMO

## 2014-09-21 LAB — BASIC METABOLIC PANEL
Anion gap: 14 (ref 5–15)
Anion gap: 14 (ref 5–15)
BUN: 141 mg/dL — AB (ref 6–20)
BUN: 143 mg/dL — AB (ref 6–20)
CALCIUM: 8.7 mg/dL — AB (ref 8.9–10.3)
CHLORIDE: 111 mmol/L (ref 101–111)
CO2: 20 mmol/L — AB (ref 22–32)
CO2: 21 mmol/L — ABNORMAL LOW (ref 22–32)
CREATININE: 6.83 mg/dL — AB (ref 0.61–1.24)
Calcium: 9.1 mg/dL (ref 8.9–10.3)
Chloride: 112 mmol/L — ABNORMAL HIGH (ref 101–111)
Creatinine, Ser: 7.03 mg/dL — ABNORMAL HIGH (ref 0.61–1.24)
GFR calc Af Amer: 8 mL/min — ABNORMAL LOW (ref >60)
GFR calc non Af Amer: 7 mL/min — ABNORMAL LOW (ref >60)
GFR, EST AFRICAN AMERICAN: 9 mL/min — AB (ref >60)
GFR, EST NON AFRICAN AMERICAN: 7 mL/min — AB (ref >60)
Glucose, Bld: 119 mg/dL — ABNORMAL HIGH (ref 65–99)
Glucose, Bld: 200 mg/dL — ABNORMAL HIGH (ref 65–99)
POTASSIUM: 4.7 mmol/L (ref 3.5–5.1)
Potassium: 4.9 mmol/L (ref 3.5–5.1)
Sodium: 146 mmol/L — ABNORMAL HIGH (ref 135–145)
Sodium: 146 mmol/L — ABNORMAL HIGH (ref 135–145)

## 2014-09-21 LAB — CBC
HCT: 23.7 % — ABNORMAL LOW (ref 39.0–52.0)
HCT: 24 % — ABNORMAL LOW (ref 39.0–52.0)
HEMOGLOBIN: 7.4 g/dL — AB (ref 13.0–17.0)
HEMOGLOBIN: 7.5 g/dL — AB (ref 13.0–17.0)
MCH: 27 pg (ref 26.0–34.0)
MCH: 27 pg (ref 26.0–34.0)
MCHC: 31.2 g/dL (ref 30.0–36.0)
MCHC: 31.3 g/dL (ref 30.0–36.0)
MCV: 86.5 fL (ref 78.0–100.0)
MCV: 86.3 fL (ref 78.0–100.0)
PLATELETS: 211 10*3/uL (ref 150–400)
PLATELETS: 236 10*3/uL (ref 150–400)
RBC: 2.74 MIL/uL — AB (ref 4.22–5.81)
RBC: 2.78 MIL/uL — AB (ref 4.22–5.81)
RDW: 14.3 % (ref 11.5–15.5)
RDW: 14.1 % (ref 11.5–15.5)
WBC: 7 10*3/uL (ref 4.0–10.5)
WBC: 8.6 10*3/uL (ref 4.0–10.5)

## 2014-09-21 LAB — GLUCOSE, CAPILLARY
GLUCOSE-CAPILLARY: 172 mg/dL — AB (ref 65–99)
Glucose-Capillary: 117 mg/dL — ABNORMAL HIGH (ref 65–99)
Glucose-Capillary: 161 mg/dL — ABNORMAL HIGH (ref 65–99)
Glucose-Capillary: 214 mg/dL — ABNORMAL HIGH (ref 65–99)
Glucose-Capillary: 202 mg/dL — ABNORMAL HIGH (ref 65–99)
Glucose-Capillary: 208 mg/dL — ABNORMAL HIGH (ref 65–99)
Glucose-Capillary: 200 mg/dL — ABNORMAL HIGH (ref 65–99)

## 2014-09-21 LAB — TRIGLYCERIDES: Triglycerides: 219 mg/dL — ABNORMAL HIGH (ref <150)

## 2014-09-21 MED ORDER — PRO-STAT SUGAR FREE PO LIQD
60.0000 mL | Freq: Three times a day (TID) | ORAL | Status: DC
  Administered 2014-09-21 – 2014-09-27 (×17): 60 mL
  Filled 2014-09-21 (×20): qty 60

## 2014-09-21 MED ORDER — LABETALOL HCL 5 MG/ML IV SOLN
30.0000 mg | INTRAVENOUS | Status: DC | PRN
  Administered 2014-09-21: 30 mg via INTRAVENOUS
  Filled 2014-09-21: qty 8

## 2014-09-21 MED ORDER — OXEPA PO LIQD
1000.0000 mL | ORAL | Status: DC
  Administered 2014-09-22 – 2014-09-27 (×6): 1000 mL
  Filled 2014-09-21 (×9): qty 1000

## 2014-09-21 MED ORDER — DEXTROSE 5 % IV SOLN
INTRAVENOUS | Status: DC
  Administered 2014-09-21: 22:00:00 via INTRAVENOUS

## 2014-09-21 MED ORDER — HYDRALAZINE HCL 25 MG PO TABS
25.0000 mg | ORAL_TABLET | Freq: Three times a day (TID) | ORAL | Status: DC
  Administered 2014-09-21 – 2014-09-25 (×9): 25 mg via ORAL
  Filled 2014-09-21 (×20): qty 1

## 2014-09-21 MED ORDER — DEXTROSE-NACL 5-0.9 % IV SOLN: INTRAVENOUS | Status: DC

## 2014-09-21 MED ORDER — HYDRALAZINE HCL 20 MG/ML IJ SOLN
10.0000 mg | INTRAMUSCULAR | Status: DC | PRN
  Administered 2014-09-21: 10 mg via INTRAVENOUS
  Filled 2014-09-21: qty 1

## 2014-09-21 MED ORDER — LABETALOL HCL 5 MG/ML IV SOLN
20.0000 mg | INTRAVENOUS | Status: DC | PRN
  Administered 2014-09-21 (×2): 20 mg via INTRAVENOUS
  Filled 2014-09-21 (×2): qty 4

## 2014-09-21 MED ORDER — LABETALOL HCL 200 MG PO TABS
200.0000 mg | ORAL_TABLET | Freq: Two times a day (BID) | ORAL | Status: DC
  Administered 2014-09-21 – 2014-09-25 (×8): 200 mg via ORAL
  Filled 2014-09-21 (×14): qty 1

## 2014-09-21 NOTE — Progress Notes (Signed)
PULMONARY / CRITICAL CARE MEDICINE HISTORY AND PHYSICAL EXAMINATION   Name: Roger Ashley MRN: [id number] DOB: 1945/12/30    ADMISSION DATE:  09/01/2014  PRIMARY SERVICE: PCCM  CHIEF COMPLAINT:  SOB  BRIEF PATIENT DESCRIPTION: 49 M with EF 40-45%, CKD 4, DM, HTN who presented to [facility] with worsening SOB 2/2 ARDS vs aCHF and eventually required intubation. Transferred to [facility] for further management.   SIGNIFICANT EVENTS / STUDIES:  5/20: Admit with respiratory distress, ABG on admission 7.336/36.2/69.2 (on BiPAP).  Intubated, ARDS protocol  5/22: Not synchronous on vent, CXR improved.  ARDS protocol d/c'd, f/u ABG >> 7.15/ 63/79/22.  Nimbex gtt started. Poorly controlled CBGs --> transitioned to resistant SSI 5/23: Off Nimbex and Levo during the day, Levo restarted overnight briefly. Pt able to open eyes to name. 5/25: Still with volume on board. Lasix given. Off paralytic, agitated off sedation.   SUBJECTIVE: Good uop, neg balance.   VITAL SIGNS: Temp:  [98 F (36.7 C)-99.2 F (37.3 C)] 98.1 F (36.7 C) (05/26 0734) Pulse Rate:  [87-121] 98 (05/26 0742) Resp:  [8-18] 18 (05/26 0742) BP: (140-214)/(1-91) 172/76 mmHg (05/26 0742) SpO2:  [98 %-100 %] 100 % (05/26 0742) FiO2 (%):  [40 %] 40 % (05/26 0742)   HEMODYNAMICS:     VENTILATOR SETTINGS: Vent Mode:  [-] PSV;CPAP FiO2 (%):  [40 %] 40 % Set Rate:  [15 bmp] 15 bmp Vt Set:  [510 mL-5110 mL] 5110 mL PEEP:  [5 cmH20] 5 cmH20 Pressure Support:  [10 cmH20-12 cmH20] 10 cmH20 Plateau Pressure:  [10 cmH20-11 cmH20] 10 cmH20   INTAKE / OUTPUT: Intake/Output      05/25 0701 - 05/26 0700 05/26 0701 - 05/27 0700   I.V. (mL/kg) 1263.6 (12.8)    NG/GT 350    IV Piggyback 125    Total Intake(mL/kg) 1738.6 (17.6)    Urine (mL/kg/hr) 3070 (1.3)    Stool 1 (0)    Total Output 3071     Net -1332.4          Stool Occurrence 1 x      PHYSICAL EXAMINATION: General: NAD, Intubated HEENT: OETT in place, jvd down Neck: Trachea supple  and midline Cardiovascular:  RRR, no MRG Lungs: CTAB,  Abdomen: S, NT, ND Musculoskeletal: No acute deformities  Skin: Warm/dry, no appreciable edema  Neuro: Sedated on vent rass -3  LABS:  CBC  Recent Labs Lab 09/19/14 0353 09/20/14 0350 09/21/14 0359  WBC 9.3 6.4 7.0  HGB 8.6* 8.1* 7.4*  HCT 25.5* 26.6* 23.7*  PLT 197 215 211   Coag's No results for input(s): APTT, INR in the last 168 hours.   BMET  Recent Labs Lab 09/20/14 09/20/14 1753 09/21/14 0359  NA 144 142 146*  K 5.0 5.3* 4.7  CL 111 110 112*  CO2 21* 18* 20*  BUN 114* 135* 141*  CREATININE 6.79* 6.89* 7.03*  GLUCOSE 158* 239* 119*   Electrolytes  Recent Labs Lab 09/16/14 0500 09/17/14 0300  09/20/14 09/20/14 1753 09/21/14 0359  CALCIUM 8.5*  --   < > 8.5* 8.8* 8.7*  MG  --  1.9  --   --   --   --   PHOS 3.9 6.8*  --   --   --   --   < > = values in this interval not displayed. Sepsis Markers  Recent Labs Lab 08/28/2014 1617 09/17/14 1100 09/18/14 0330 09/19/14 0353  LATICACIDVEN 1.4  --   --   --  PROCALCITON  --  62.06 56.05 45.06   ABG  Recent Labs Lab 09/17/14 0901 09/17/14 1305 09/18/14 0443  PHART 7.155* 7.301* 7.409  PCO2ART 63.5* 40.1 30.4*  PO2ART 79.0* 177.0* 72.5*   Liver Enzymes  Recent Labs Lab 09/18/14 0330  AST 17  ALT 16*  ALKPHOS 53  BILITOT 0.7  ALBUMIN 2.3*     Cardiac Enzymes  Recent Labs Lab 08/31/2014 1305 09/17/14 1453 09/18/14 0330  TROPONINI 0.03 0.10* 0.08*   Glucose  Recent Labs Lab 09/20/14 0438 09/20/14 0745 09/20/14 1139 09/20/14 1628 09/20/14 2007 09/20/14 2328  GLUCAP 116* 117* 234* 222* 192* 172*    Imaging Ct Head Wo Contrast  09/19/2014   CLINICAL DATA:  Altered mental status.  Patient is unresponsive.  EXAM: CT HEAD WITHOUT CONTRAST  TECHNIQUE: Contiguous axial images were obtained from the base of the skull through the vertex without intravenous contrast.  COMPARISON:  None.  FINDINGS: Skull and Sinuses:No fracture  or destructive process. There is partial bilateral mastoid opacification which is likely related to nasopharyngeal fluid in the setting of intubation.  Orbits: No acute abnormality.  Brain: No evidence of acute infarction, hemorrhage, hydrocephalus, or mass lesion/mass effect. There is symmetric gray-Perine differentiation, suboptimally evaluated due to technical limitations. There is patchy bilateral cerebral [place] matter low density compatible with mild chronic small vessel ischemia.  IMPRESSION: 1. No acute findings. 2. Mild small vessel disease. 3. Bilateral mastoid opacification, likely from intubation.   Electronically Signed   By: Marnee Spring M.D.   On: 09/19/2014 23:29   Dg Chest Port 1 View  09/21/2014   CLINICAL DATA:  Acute respiratory failure, community-acquired pneumonia, stage IV chronic renal insufficiency, CHF.  EXAM: PORTABLE CHEST - 1 VIEW  COMPARISON:  Portable chest x-ray of Sep 20, 2014  FINDINGS: The lungs are adequately inflated. The interstitial markings remain increased bilaterally. The cardiac silhouette remains enlarged. The pulmonary vascularity is engorged. The endotracheal tube tip lies 7.7 cm above the carina. The esophagogastric tube tip projects below the inferior margin of the image. The left internal jugular venous catheter tip projects over the mid SVC. There is no pleural effusion or pneumothorax. The bony thorax is unremarkable.  IMPRESSION: Mild pulmonary interstitial edema associated with cardiomegaly consistent with low-grade CHF. There has not been significant change since yesterday's study.   Electronically Signed   By: David  [place] M.D.   On: 09/21/2014 07:43   Dg Chest Port 1 View  09/20/2014   CLINICAL DATA:  Shortness of breath, pneumonia.  EXAM: PORTABLE CHEST - 1 VIEW  COMPARISON:  09/19/2014.  FINDINGS: Endotracheal tube terminates 4.3 cm above the carina. Nasogastric tube is followed into the stomach. Left IJ central line tip projects over the SVC. Heart  size stable. Patchy bibasilar airspace opacification persists, left greater than right. No definite pleural fluid.  IMPRESSION: Patchy bibasilar airspace opacification persists, left greater than right, suspicious for pneumonia.   Electronically Signed   By: Leanna Battles M.D.   On: 09/20/2014 07:21    ASSESSMENT / PLAN:  PULMONARY A: Acute hypoxemic respiratory failure - DDx includes ARDS vs acute heart failure.  BNP 547 on admit.  Diffuse Bilateral Airspace Disease  ARDS P:   Remains on min settings Prop reduction, wean cpap5 ps 5, goal 2 hr, assess rsbi, abg pcxr w/o significant change No sig edema, neg balance Hold Lasix  CARDIOVASCULAR L IJ TLC 5/20 >>  A: Chronic Systolic HF - Unclear why on both CCB and BB.  Per chart review most care at [facility].  H/o HTN Off pressors, now hypertensive P:   Labetalol BID ICU monitoring  ASA   RENAL A: AKI on CKD - Likely 2/2 aggressive diuresis and ATN uop improved with diuresis Hypernatremia w/ low urine osmols, ?DI P:   Try to keep euvolemic May need some form of renal replacement, but not overloaded, k wnl Urine output improved after lasix ATN driving bmet [id number]  GASTROINTESTINAL A: Vent Associated Dysphagia BM x1 P:  Continue TF: Oxepa PPI  Bowel regimen: Colace, Mirilax   HEMATOLOGIC A: Anemia - Likely AOCD Hgb down to 7.4, no active bleeding P:   CBC q12 DVT PPx: [place] Heparin  INFECTIOUS Sputum Culture 5/21 >> Blood Culture x 2 5/20 >> Strep Pneumo 5/22 >> neg  pCXR unchanged A: HCAP vs CAP --> initially on broad coverage due to unclear medical background Narrowed off Vanc and Cefepime 5/23 P:   Urine Legionella Ag - neg Vanco, start date 5/21>>>5/23 Cefepime, start date 5/21>>>5/23  Azithro, start date 5/20>>> 5/25 Ceftriaxone, start date 5/23>> stop 29th  ENDOCRINE A: DM Improved CBGs with resistant SSI and addition of Lantus P:   Resistant SSI Continue Lantus 5u  NEUROLOGIC A: ICU Associated  Discomfort Delirium  P:  Monitor neuro status  Propofol for sedation, wean as tolerated WUA Fentanyl gtt for pain  May need Risperdal for agitation     FAMILY: Family updated by Dr. Tyson Alias 5/23, 24th, and by resident 5/25   Genelle Gather, MD Internal Medicine Resident, PGY III [facility] 09/21/2014 8:15 AM    STAFF NOTE: Cindi Carbon, MD FACP have personally reviewed patient's available data, including medical history, events of note, physical examination and test results as part of my evaluation. I have discussed with resident/NP and other care providers such as pharmacist, RN and RRT. In addition, I personally evaluated patient and elicited key findings of: more awake, lung exam unchanged, WUA, weaning this am requires PS 10, pcxr concern edema increased, but renal fxn worse, hold lasix, did make excellent urine output with lasix, chem in afternoon and pm, mayned renal called in am, labetalol increase oral and IV, upright position, pcxr in am, sedation successfully reduced, rocephin to stop date The patient is critically ill with multiple organ systems failure and requires high complexity decision making for assessment and support, frequent evaluation and titration of therapies, application of advanced monitoring technologies and extensive interpretation of multiple databases.   Critical Care Time devoted to patient care services described in this note is30 Minutes. This time reflects time of care of this signee: Rory Percy, MD FACP. This critical care time does not reflect procedure time, or teaching time or supervisory time of PA/NP/Med student/Med Resident etc but could involve care discussion time. Rest per NP/medical resident whose note is outlined above and that I agree with   Mcarthur Rossetti. Tyson Alias, MD, FACP Pgr: [phone] [place] [facility] 09/21/2014 8:27 AM

## 2014-09-21 NOTE — Telephone Encounter (Signed)
-----   Message from Jonelle Sidle, MD sent at 09/19/2014  9:07 AM EDT ----- Reviewed report. LVEF in range of 40-45% at this point, not quite as good as last documented several years ago, however he has been clinically stable on medical therapy and does not require further intervention now.

## 2014-09-21 NOTE — Progress Notes (Signed)
ETT advanced 3 cm per MD order from 23 to 26 at the lips. Tube re secured in place.

## 2014-09-21 NOTE — Progress Notes (Signed)
Wasted 58mL fentanyl down sink with Layne Benton RN

## 2014-09-21 NOTE — Telephone Encounter (Signed)
5/26 LMTCB-cc

## 2014-09-21 NOTE — Progress Notes (Signed)
Nutrition Follow-up  DOCUMENTATION CODES:  Obesity unspecified  INTERVENTION:   Continue TF via OGT, increase Oxepa to 20 ml/h (480 ml per day) with Prostat 60 ml TID to provide 1320 kcals, 120 gm protein, 377 ml free water daily.  NUTRITION DIAGNOSIS:  Inadequate oral intake related to inability to eat as evidenced by NPO status.  Ongoing.  GOAL:  Provide needs based on ASPEN/SCCM guidelines  Unmet.  MONITOR:  Vent status, Labs, Weight trends, TF tolerance  ASSESSMENT:  Patient presented to [facility] on 5/20 with worsening SOB due to ARDS vs CHF; required intubation. [facility]  Patient is currently intubated on ventilator support MV: 10.4 L/min Temp (24hrs), Avg:98.5 F (36.9 C), Min:98.1 F (36.7 C), Max:99.2 F (37.3 C)  Propofol: off  Discussed patient with RN today. Propofol is now off. TF residuals have been ~300-375 ml each time checked yesterday and the day before. He had 2 BM's and now no residuals.   Patient is currently receiving Oxepa via OGT at 10 ml/h (240 ml/day) with Prostat 30 ml QID to provide 760 kcals, 75 gm protein, 188 ml free water daily.   Labs reviewed: Sodium, BUN, creatinine are elevated.  Height:  Ht Readings from Last 1 Encounters:  09/16/14 5\' 6"  (1.676 m)    Weight:  Wt Readings from Last 1 Encounters:  09/20/14 217 lb 13 oz (98.8 kg)    Ideal Body Weight:  64.5 kg  Wt Readings from Last 10 Encounters:  09/20/14 217 lb 13 oz (98.8 kg)  09/11/14 224 lb (101.606 kg)  05/26/12 236 lb (107.049 kg)  11/12/11 228 lb (103.42 kg)  10/29/11 224 lb (101.606 kg)  09/29/11 229 lb (103.874 kg)  04/01/11 233 lb (105.688 kg)  03/05/10 226 lb (102.513 kg)  02/16/09 229 lb (103.874 kg)    BMI:  Body mass index is 35.17 kg/(m^2).  Estimated Nutritional Needs:  Kcal:  7494-4967  Protein:  129 gm  Fluid:  1.8 L  Skin:  Reviewed, no issues  Diet Order:   NPO  EDUCATION NEEDS:  No  education needs identified at this time   Intake/Output Summary (Last 24 hours) at 09/21/14 1241 Last data filed at 09/21/14 1115  Gross per 24 hour  Intake 1646.27 ml  Output   3070 ml  Net -1423.73 ml    Last BM:  5/25   Joaquin Courts, RD, LDN, CNSC Pager [phone] After Hours Pager [phone]

## 2014-09-21 NOTE — Care Management Note (Signed)
Case Management Note  Patient Details  Name: Roger Ashley MRN: [id number] Date of Birth: 06/15/1945  Subjective/Objective:     09-21-14  Patient remains on vent -   Verified  Patient lived at home prior to this admission with wife and was independent.           Action/Plan: CM will continue to follow for further needs.   Expected Discharge Date:                  Expected Discharge Plan:  Home w Home Health Services  In-House Referral:     Discharge planning Services     Post Acute Care Choice:    Choice offered to:     DME Arranged:    DME Agency:     HH Arranged:    HH Agency:     Status of Service:  In process, will continue to follow  Medicare Important Message Given:    Date Medicare IM Given:    Medicare IM give by:    Date Additional Medicare IM Given:    Additional Medicare Important Message give by:     If discussed at Long Length of Stay Meetings, dates discussed:    Additional Comments:  Vangie Bicker, RN 09/21/2014, 1:42 PM

## 2014-09-22 ENCOUNTER — Inpatient Hospital Stay (HOSPITAL_COMMUNITY): Payer: Commercial Managed Care - HMO

## 2014-09-22 DIAGNOSIS — M7989 Other specified soft tissue disorders: Secondary | ICD-10-CM

## 2014-09-22 LAB — BASIC METABOLIC PANEL
ANION GAP: 9 (ref 5–15)
Anion gap: 9 (ref 5–15)
BUN: 154 mg/dL — ABNORMAL HIGH (ref 6–20)
BUN: 158 mg/dL — ABNORMAL HIGH (ref 6–20)
CALCIUM: 8.7 mg/dL — AB (ref 8.9–10.3)
CALCIUM: 8.8 mg/dL — AB (ref 8.9–10.3)
CO2: 21 mmol/L — ABNORMAL LOW (ref 22–32)
CO2: 23 mmol/L (ref 22–32)
CREATININE: 6.79 mg/dL — AB (ref 0.61–1.24)
Chloride: 112 mmol/L — ABNORMAL HIGH (ref 101–111)
Chloride: 116 mmol/L — ABNORMAL HIGH (ref 101–111)
Creatinine, Ser: 6.73 mg/dL — ABNORMAL HIGH (ref 0.61–1.24)
GFR calc Af Amer: 9 mL/min — ABNORMAL LOW (ref >60)
GFR calc Af Amer: 9 mL/min — ABNORMAL LOW (ref >60)
GFR calc non Af Amer: 8 mL/min — ABNORMAL LOW (ref >60)
GFR, EST NON AFRICAN AMERICAN: 8 mL/min — AB (ref >60)
Glucose, Bld: 261 mg/dL — ABNORMAL HIGH (ref 65–99)
Glucose, Bld: 214 mg/dL — ABNORMAL HIGH (ref 65–99)
Potassium: 4.7 mmol/L (ref 3.5–5.1)
Potassium: 5.3 mmol/L — ABNORMAL HIGH (ref 3.5–5.1)
Sodium: 142 mmol/L (ref 135–145)
Sodium: 148 mmol/L — ABNORMAL HIGH (ref 135–145)

## 2014-09-22 LAB — CBC
HEMATOCRIT: 23.2 % — AB (ref 39.0–52.0)
HEMOGLOBIN: 7.2 g/dL — AB (ref 13.0–17.0)
MCH: 26.8 pg (ref 26.0–34.0)
MCHC: 31 g/dL (ref 30.0–36.0)
MCV: 86.2 fL (ref 78.0–100.0)
Platelets: 240 10*3/uL (ref 150–400)
RBC: 2.69 MIL/uL — ABNORMAL LOW (ref 4.22–5.81)
RDW: 14.4 % (ref 11.5–15.5)
WBC: 9.2 10*3/uL (ref 4.0–10.5)

## 2014-09-22 LAB — GLUCOSE, CAPILLARY
GLUCOSE-CAPILLARY: 196 mg/dL — AB (ref 65–99)
Glucose-Capillary: 239 mg/dL — ABNORMAL HIGH (ref 65–99)
Glucose-Capillary: 212 mg/dL — ABNORMAL HIGH (ref 65–99)
Glucose-Capillary: 198 mg/dL — ABNORMAL HIGH (ref 65–99)
Glucose-Capillary: 188 mg/dL — ABNORMAL HIGH (ref 65–99)

## 2014-09-22 LAB — PROTIME-INR
INR: 1.29 (ref 0.00–1.49)
Prothrombin Time: 16.3 seconds — ABNORMAL HIGH (ref 11.6–15.2)

## 2014-09-22 MED ORDER — PRISMASOL BGK 4/2.5 32-4-2.5 MEQ/L IV SOLN
INTRAVENOUS | Status: DC
  Administered 2014-09-22: 16:00:00 via INTRAVENOUS_CENTRAL
  Filled 2014-09-22 (×2): qty 5000

## 2014-09-22 MED ORDER — PRISMASOL BGK 4/2.5 32-4-2.5 MEQ/L IV SOLN
INTRAVENOUS | Status: DC
  Administered 2014-09-22 – 2014-09-25 (×28): via INTRAVENOUS_CENTRAL
  Filled 2014-09-22 (×41): qty 5000

## 2014-09-22 MED ORDER — ETOMIDATE 2 MG/ML IV SOLN
0.3000 mg/kg | Freq: Once | INTRAVENOUS | Status: AC
  Administered 2014-09-22: 20 mg via INTRAVENOUS
  Filled 2014-09-22: qty 14.58

## 2014-09-22 MED ORDER — ETOMIDATE 2 MG/ML IV SOLN
INTRAVENOUS | Status: AC
  Administered 2014-09-22: 20 mg via INTRAVENOUS
  Filled 2014-09-22: qty 10

## 2014-09-22 MED ORDER — HEPARIN SODIUM (PORCINE) 1000 UNIT/ML DIALYSIS
1000.0000 [IU] | INTRAMUSCULAR | Status: DC | PRN
  Administered 2014-09-22: 2800 [IU] via INTRAVENOUS_CENTRAL
  Administered 2014-09-24: 2400 [IU] via INTRAVENOUS_CENTRAL
  Administered 2014-09-26: 2800 [IU] via INTRAVENOUS_CENTRAL
  Filled 2014-09-22 (×2): qty 3
  Filled 2014-09-22 (×4): qty 6
  Filled 2014-09-22: qty 3

## 2014-09-22 MED ORDER — PRISMASOL BGK 4/2.5 32-4-2.5 MEQ/L IV SOLN
INTRAVENOUS | Status: DC
  Administered 2014-09-22: 16:00:00 via INTRAVENOUS_CENTRAL
  Filled 2014-09-22 (×3): qty 5000

## 2014-09-22 MED ORDER — SODIUM CHLORIDE 0.9 % FOR CRRT
INTRAVENOUS_CENTRAL | Status: DC | PRN
  Filled 2014-09-22: qty 1000

## 2014-09-22 NOTE — Progress Notes (Signed)
RT advanced ETT 2.5cm from 22.5 to 25 cm at the lips per MD order.

## 2014-09-22 NOTE — Procedures (Signed)
Central Venous Catheter Insertion Procedure Note Cleto Coombe [id number] [birth year]  Procedure: Insertion of Central Venous Catheter Indications: hd   Procedure Details Consent: Risks of procedure as well as the alternatives and risks of each were explained to the (patient/caregiver).  Consent for procedure obtained. wife and son,  He also had a fistula placed nto mature , showing that he has thought about acceptance of HD , also I called his vet renla doc agrees Time Out: Verified patient identification, verified procedure, site/side was marked, verified correct patient position, special equipment/implants available, medications/allergies/relevent history reviewed, required imaging and test results available.  Performed  Maximum sterile technique was used including antiseptics, cap, gloves, gown, hand hygiene, mask and sheet. Skin prep: Chlorhexidine; local anesthetic administered A antimicrobial bonded/coated triple lumen catheter was placed in the right internal jugular vein using the Seldinger technique.  Evaluation Blood flow good Complications: No apparent complications Patient did tolerate procedure well. Chest X-ray ordered to verify placement.  CXR: pending.  Nelda Bucks 09/22/2014, 2:21 PM  [place] Blood loss less 1 cc  Mcarthur Rossetti. Tyson Alias, MD, FACP Pgr: [phone] [place] [facility]\

## 2014-09-22 NOTE — Progress Notes (Signed)
*  Preliminary Results* Left upper extremity venous duplex completed. Visualized veins of the left upper extremity are negative for deep and superficial vein thrombosis.  09/22/2014 2:59 PM  Gertie Fey, RVT, RDCS, RDMS

## 2014-09-22 NOTE — Progress Notes (Addendum)
PULMONARY / CRITICAL CARE MEDICINE HISTORY AND PHYSICAL EXAMINATION   Name: Roger Ashley MRN: [id number] DOB: [birth year]    ADMISSION DATE:  09/09/2014  PRIMARY SERVICE: PCCM  CHIEF COMPLAINT:  SOB  BRIEF PATIENT DESCRIPTION: 22 M with EF 40-45%, CKD 4, DM, HTN who presented to [facility] with worsening SOB 2/2 ARDS vs aCHF and eventually required intubation. Transferred to [facility] for further management.   SIGNIFICANT EVENTS / STUDIES:  5/20: Admit with respiratory distress, ABG on admission 7.336/36.2/69.2 (on BiPAP).  Intubated, ARDS protocol  5/22: Not synchronous on vent, CXR improved.  ARDS protocol d/c'd, f/u ABG >> 7.15/ 63/79/22.  Nimbex gtt started. Poorly controlled CBGs --> transitioned to resistant SSI 5/23: Off Nimbex and Levo during the day, Levo restarted overnight briefly. Pt able to open eyes to name. 5/25: Still with volume on board. Lasix given. Off paralytic, agitated off sedation.  5/26: Worsening renal fx but with good uop.   SUBJECTIVE: Continues to have good uop, neg balance. Unable to wean on vent.  VITAL SIGNS: Temp:  [98.1 F (36.7 C)-99.7 F (37.6 C)] 99.6 F (37.6 C) (05/27 0316) Pulse Rate:  [88-103] 91 (05/27 0600) Resp:  [14-24] 16 (05/27 0600) BP: (113-178)/(45-142) 143/49 mmHg (05/27 0600) SpO2:  [96 %-100 %] 96 % (05/27 0600) FiO2 (%):  [40 %] 40 % (05/27 0507) Weight:  [214 lb 4.6 oz (97.2 kg)] 214 lb 4.6 oz (97.2 kg) (05/27 0420)   HEMODYNAMICS:     VENTILATOR SETTINGS: Vent Mode:  [-] PRVC FiO2 (%):  [40 %] 40 % Set Rate:  [15 bmp] 15 bmp Vt Set:  [510 mL] 510 mL PEEP:  [5 cmH20] 5 cmH20 Pressure Support:  [10 cmH20] 10 cmH20 Plateau Pressure:  [12 cmH20-18 cmH20] 18 cmH20   INTAKE / OUTPUT: Intake/Output      05/26 0701 - 05/27 0700 05/27 0701 - 05/28 0700   I.V. (mL/kg) 879.3 (9)    NG/GT 760    IV Piggyback 50    Total Intake(mL/kg) 1689.3 (17.4)    Urine (mL/kg/hr) 2800 (1.2)    Stool     Total Output 2800     Net -1110.8             PHYSICAL EXAMINATION: General: NAD, Intubated HEENT: OETT in place, jvd down Neck: Trachea supple and midline Cardiovascular:  RRR, continuous murmur Lungs: CTAB,  Abdomen: S, NT, ND Musculoskeletal: No acute deformities  Skin: Warm/dry, no appreciable edema. LUE AVF, bruit and thrill present.  Neuro: Minimal sedation, able to open eye on command.  LABS:  CBC  Recent Labs Lab 09/21/14 0359 09/21/14 1700 09/22/14 0427  WBC 7.0 8.6 9.2  HGB 7.4* 7.5* 7.2*  HCT 23.7* 24.0* 23.2*  PLT 211 236 240   Coag's No results for input(s): APTT, INR in the last 168 hours.   BMET  Recent Labs Lab 09/21/14 0359 09/21/14 1700 09/22/14 0427  NA 146* 146* 142  K 4.7 4.9 4.7  CL 112* 111 112*  CO2 20* 21* 21*  BUN 141* 143* 154*  CREATININE 7.03* 6.83* 6.73*  GLUCOSE 119* 200* 261*   Electrolytes  Recent Labs Lab 09/16/14 0500 09/17/14 0300  09/21/14 0359 09/21/14 1700 09/22/14 0427  CALCIUM 8.5*  --   < > 8.7* 9.1 8.7*  MG  --  1.9  --   --   --   --   PHOS 3.9 6.8*  --   --   --   --   < > =  values in this interval not displayed. Sepsis Markers  Recent Labs Lab Sep 16, 2014 1617 09/17/14 1100 09/18/14 0330 09/19/14 0353  LATICACIDVEN 1.4  --   --   --   PROCALCITON  --  62.06 56.05 45.06   ABG  Recent Labs Lab 09/17/14 0901 09/17/14 1305 09/18/14 0443  PHART 7.155* 7.301* 7.409  PCO2ART 63.5* 40.1 30.4*  PO2ART 79.0* 177.0* 72.5*   Liver Enzymes  Recent Labs Lab 09/18/14 0330  AST 17  ALT 16*  ALKPHOS 53  BILITOT 0.7  ALBUMIN 2.3*     Cardiac Enzymes  Recent Labs Lab 09-16-2014 1305 09/17/14 1453 09/18/14 0330  TROPONINI 0.03 0.10* 0.08*   Glucose  Recent Labs Lab 09/21/14 0729 09/21/14 1151 09/21/14 1543 09/21/14 1956 09/21/14 2310 09/22/14 0313  GLUCAP 161* 214* 202* 208* 200* 239*    Imaging Dg Chest Port 1 View  09/21/2014   CLINICAL DATA:  Endotracheal tube advancement.  EXAM: PORTABLE CHEST - 1 VIEW  COMPARISON:   Earlier the same date and 09/20/2014.  FINDINGS: 0928 hours. Endotracheal tube tip is in the midtrachea, 3.6 cm above the carina. Nasogastric tube extends into the stomach, tip not visualized. Left IJ central venous catheter tip is unchanged at the mid SVC level. There are persistent low lung volumes with mild bibasilar atelectasis and vascular congestion. No confluent airspace opacity or significant pleural effusion identified. The heart size and mediastinal contours are stable.  IMPRESSION: Endotracheal tube tip in the mid trachea. Otherwise stable examination.   Electronically Signed   By: Carey Bullocks M.D.   On: 09/21/2014 10:27   Dg Chest Port 1 View  09/21/2014   CLINICAL DATA:  Acute respiratory failure, community-acquired pneumonia, stage IV chronic renal insufficiency, CHF.  EXAM: PORTABLE CHEST - 1 VIEW  COMPARISON:  Portable chest x-ray of Sep 20, 2014  FINDINGS: The lungs are adequately inflated. The interstitial markings remain increased bilaterally. The cardiac silhouette remains enlarged. The pulmonary vascularity is engorged. The endotracheal tube tip lies 7.7 cm above the carina. The esophagogastric tube tip projects below the inferior margin of the image. The left internal jugular venous catheter tip projects over the mid SVC. There is no pleural effusion or pneumothorax. The bony thorax is unremarkable.  IMPRESSION: Mild pulmonary interstitial edema associated with cardiomegaly consistent with low-grade CHF. There has not been significant change since yesterday's study.   Electronically Signed   By: David  [place] M.D.   On: 09/21/2014 07:43    ASSESSMENT / PLAN:  PULMONARY A: Acute hypoxemic respiratory failure - DDx includes ARDS vs acute heart failure.  BNP 547 on admit.  Diffuse Bilateral Airspace Disease  ARDS P:   Remains on min settings Prop reduction, wean cpap5 ps 5, goal 1 hr, assess rsbi, abg May need improved neurostatus for extubation success pcxr appears stable and  improved aeratrion No sig edema, neg balance was obtained on own Holding Lasix  CARDIOVASCULAR L IJ TLC 5/20 >>  A: Chronic Systolic HF- Per chart review most care at [facility].  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 Hgb slowly trending down to 7.2, no active bleeding P:   CBC in AM DVT PPx: [place] Heparin  INFECTIOUS Sputum Culture 5/21 >> Blood Culture x 2 5/20 >> Strep Pneumo 5/22 >> neg  pCXR with increased infiltrate on left A: HCAP vs CAP --> initially on broad coverage due to unclear medical background Narrowed off Vanc and Cefepime 5/23 P:   Urine Legionella Ag - neg Vanco, start date 5/21>>>5/23 Cefepime, start date 5/21>>>5/23  Azithro, start date 5/20>>> 5/25 Ceftriaxone, start date 5/23>> stop 29th  ENDOCRINE A: DM Improved CBGs with resistant SSI and addition of Lantus P:   Resistant SSI Continue Lantus 5u Dc d5 in fluids  NEUROLOGIC A: ICU Associated Discomfort Delirium  Minimal sedation, opens eyes on WUA P:  Monitor neuro status  Off Propofol WUA Fentanyl gtt for pain  May need Risperdal for agitation     FAMILY: Family updated by Dr. Tyson Alias 5/23, 24th, and by resident 5/25   Genelle Gather, MD Internal Medicine Resident, PGY III [facility] Internal Medicine Program 09/22/2014 7:10 AM    STAFF NOTE: Cindi Carbon, MD FACP have personally reviewed  patient's available data, including medical history, events of note, physical examination and test results as part of my evaluation. I have discussed with resident/NP and other care providers such as pharmacist, RN and RRT. In addition, I personally evaluated patient and elicited key findings of: having a good day, on min settings, lung exam and pcxr improved, was neg balanceon own or residual lasix effect, renal input, has fistula, goal weaning to extubate, may need HD sessions, abx to stop 29th The patient is critically ill with multiple organ systems failure and requires high complexity decision making for assessment and support, frequent evaluation and titration of therapies, application of advanced monitoring technologies and extensive interpretation of multiple databases.   Critical Care Time devoted to patient care services described in this note is 35 Minutes. This time reflects time of care of this signee: Rory Percy, MD FACP. This critical care time does not reflect procedure time, or teaching time or supervisory time of PA/NP/Med student/Med Resident etc but could involve care discussion time. Rest per NP/medical resident whose note is outlined above and that I agree with   Mcarthur Rossetti. Tyson Alias, MD, FACP Pgr: [phone]  Pulmonary & Critical Care 09/22/2014 9:39 AM

## 2014-09-22 NOTE — Progress Notes (Signed)
Inpatient Diabetes Program Recommendations  AACE/ADA: New Consensus Statement on Inpatient Glycemic Control (2013)  Target Ranges:  Prepandial:   less than 140 mg/dL      Peak postprandial:   less than 180 mg/dL (1-2 hours)      Critically ill patients:  140 - 180 mg/dL   Reason for Assessment: Hyperglycemia  Results for Roger Ashley, Roger Ashley (MRN [id number]) as of 09/22/2014 13:51  Ref. Range 09/21/2014 19:56 09/21/2014 23:10 09/22/2014 03:13 09/22/2014 07:22 09/22/2014 11:35  Glucose-Capillary Latest Ref Range: 65-99 mg/dL 295 (H) 747 (H) 340 (H) 212 (H) 198 (H)   Continues with hyperglycemia.  Recommendations: Increase Lantus to 10 units QHS  Thank you. Ailene Ards, RD, LDN, CDE Inpatient Diabetes Coordinator [phone]

## 2014-09-22 NOTE — Consult Note (Signed)
Referring Provider: No ref. provider found Primary Care Physician:  Fredirick Maudlin, MD Primary Nephrologist:   [facility]  Reason for Consultation:  Chronic renal disease 5  Anemia secondary Hyperparathyroidism HPI: The patient has history of hypertension, nonischemic cardiomyopathy, CHF and chronic renal failure stage 5 presently came with complaints of difficulty breathing, orthopnea. He was admitted 5/20 and was intubated this was a presumptive diagnosis of pneumonia. He has an AVF left arm for dialysis place 3-4 weeks ago. Wife is patient and is concerned about the speed of decline of renal function. Baseline creatinine are about 3-4 mg/dl  Past Medical History  Diagnosis Date  . Nonischemic cardiomyopathy 2002    LVEF 15-20% initially; EF recovred to 50% by Echo in 03/2002; [facility] at [facility] EF 53%  . Diabetes mellitus type II   . Hyperlipidemia   . Essential hypertension   . 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 Erectile dysfunction   . Hearing impairment   . Anemia   . Acute respiratory failure   . CAP (community acquired pneumonia)   . Sepsis     2016    Past Surgical History  Procedure Laterality Date  . Colonoscopy w/ polypectomy    . Av fistula placement      Prior to Admission medications   Medication Sig Start Date End Date Taking? Authorizing Provider  aspirin 81 MG tablet Take 81 mg by mouth daily.     Yes Historical Provider, MD  calcitRIOL (ROCALTROL) 0.25 MCG capsule Take 0.25 mcg by mouth every other day. Monday, Wednesday and Friday   Yes Historical Provider, MD  cyanocobalamin 100 MCG tablet Take 100 mcg by mouth daily.   Yes Historical Provider, MD  furosemide (LASIX) 40 MG tablet Take 40 mg by mouth 2 (two) times daily.     Yes Historical Provider, MD  glipiZIDE (GLUCOTROL) 5 MG tablet Take 10 mg by mouth 2 (two) times daily before a meal.     Yes Historical Provider, MD  hydrALAZINE (APRESOLINE) 50 MG tablet Take 50 mg by mouth 3  (three) times daily.   Yes Historical Provider, MD  metoprolol (LOPRESSOR) 50 MG tablet Take 100 mg by mouth 2 (two) times daily.     Yes Historical Provider, MD  Multiple Vitamins-Minerals (PRESERVISION AREDS 2 PO) Take by mouth.   Yes Historical Provider, MD  NIFEdipine (ADALAT CC) 90 MG 24 hr tablet Take 90 mg by mouth daily.   Yes Historical Provider, MD    Current Facility-Administered Medications  Medication Dose Route Frequency Provider Last Rate Last Dose  . antiseptic oral rinse (CPC / CETYLPYRIDINIUM CHLORIDE 0.05%) solution 7 mL  7 mL Mouth Rinse QID Merwyn Katos, MD   7 mL at 09/22/14 0400  . aspirin chewable tablet 81 mg  81 mg Per Tube Daily Merwyn Katos, MD   81 mg at 09/22/14 0930  . calcitRIOL (ROCALTROL) capsule 0.25 mcg  0.25 mcg Oral Q M,W,F-1800 Gwenyth Bender, NP   0.25 mcg at 09/20/14 1728  . cefTRIAXone (ROCEPHIN) 1 g in dextrose 5 % 50 mL IVPB - Premix  1 g Intravenous Q24H Genelle Gather, MD   1 g at 09/22/14 1037  . chlorhexidine (PERIDEX) 0.12 % solution 15 mL  15 mL Mouth Rinse BID Merwyn Katos, MD   15 mL at 09/22/14 0753  . dextrose 5 % solution   Intravenous Continuous Nelda Bucks, MD 50 mL/hr at 09/22/14 0800    .  docusate (COLACE) 50 MG/5ML liquid 50 mg  50 mg Oral Daily Genelle Gather, MD   50 mg at 09/22/14 0931  . feeding supplement (OXEPA) liquid 1,000 mL  1,000 mL Per Tube Q24H Idell Pickles, RD 20 mL/hr at 09/22/14 0800 1,000 mL at 09/22/14 0800  . feeding supplement (PRO-STAT SUGAR FREE 64) liquid 60 mL  60 mL Per Tube TID Idell Pickles, RD   60 mL at 09/22/14 0931  . fentaNYL (SUBLIMAZE) 2,500 mcg in sodium chloride 0.9 % 250 mL (10 mcg/mL) infusion  25-300 mcg/hr Intravenous Continuous Jeanella Craze, NP 2.5 mL/hr at 09/22/14 0800 25 mcg/hr at 09/22/14 0800  . fentaNYL (SUBLIMAZE) bolus via infusion 50 mcg  50 mcg Intravenous Q30 min PRN Jeanella Craze, NP      . heparin injection 5,000 Units  5,000 Units Subcutaneous 3 times per  day Jenelle Mages, MD   5,000 Units at 09/22/14 0610  . hydrALAZINE (APRESOLINE) injection 10 mg  10 mg Intravenous Q4H PRN Nelda Bucks, MD   10 mg at 09/21/14 1813  . hydrALAZINE (APRESOLINE) tablet 25 mg  25 mg Oral 3 times per day Nelda Bucks, MD   25 mg at 09/22/14 0610  . insulin aspart (novoLOG) injection 0-20 Units  0-20 Units Subcutaneous 6 times per day Jeanella Craze, NP   7 Units at 09/22/14 0755  . insulin glargine (LANTUS) injection 5 Units  5 Units Subcutaneous QHS Genelle Gather, MD   5 Units at 09/21/14 2210  . labetalol (NORMODYNE) tablet 200 mg  200 mg Oral BID Nelda Bucks, MD   200 mg at 09/22/14 0930  . pantoprazole sodium (PROTONIX) 40 mg/20 mL oral suspension 40 mg  40 mg Per Tube Q1200 Merwyn Katos, MD   40 mg at 09/21/14 1102  . polyethylene glycol (MIRALAX / GLYCOLAX) packet 17 g  17 g Oral Daily Genelle Gather, MD   17 g at 09/22/14 0931  . propofol (DIPRIVAN) 1000 MG/100ML infusion  5-80 mcg/kg/min Intravenous Continuous Jeanella Craze, NP   Stopped at 09/21/14 0631  . vitamin B-12 (CYANOCOBALAMIN) tablet 100 mcg  100 mcg Oral Daily Lesle Chris Black, NP   100 mcg at 09/22/14 0930    Allergies as of 08/30/2014 - Review Complete 09/21/2014  Allergen Reaction Noted  . Clonidine derivatives Other (See Comments) 11/13/2011  . Crestor [rosuvastatin] Other (See Comments) 11/13/2011    Family History  Problem Relation Age of Onset  . Diabetes Other     History   Social History  . Marital Status: Married    Spouse Name: N/A  . Number of Children: 2  . Years of Education: N/A   Occupational History  . Disabled    Social History Main Topics  . Smoking status: Former Smoker -- 2.00 packs/day for 20 years    Types: Cigarettes    Start date: 04/28/1962    Quit date: 04/28/1982  . Smokeless tobacco: Never Used  . Alcohol Use: No  . Drug Use: No  . Sexual Activity: Not on file   Other Topics Concern  . Not on file   Social History  Narrative   2 sons, one deceased   Jehovah's Witness                Review of Systems: Unobtainable  Physical Exam: Vital signs in last 24 hours: Temp:  [98.5 F (36.9 C)-99.7 F (37.6 C)] 98.5 F (36.9 C) (  05/27 1154) Pulse Rate:  [82-103] 94 (05/27 1111) Resp:  [14-24] 19 (05/27 1111) BP: (113-178)/(44-142) 133/44 mmHg (05/27 1111) SpO2:  [96 %-100 %] 99 % (05/27 1111) FiO2 (%):  [40 %] 40 % (05/27 1111) Weight:  [97.2 kg (214 lb 4.6 oz)] 97.2 kg (214 lb 4.6 oz) (05/27 0420) Last BM Date: 09/21/14 General:   Ill appearing Head:  Normocephalic and atraumatic. Eyes:  Sclera clear, no icterus.   Conjunctiva pink. Ears:  Normal auditory acuity. Nose:  No deformity, discharge,  or lesions. Mouth:  No deformity or lesions, dentition normal. Neck:  Supple; no masses or thyromegaly. JVP not elevated Lungs:   Rhonchi bilateral Heart:  Regular rate and rhythm; no murmurs, clicks, rubs,  or gallops. Abdomen:   Hypoactive Msk:  Symmetrical without gross deformities. Normal posture. Pulses:  No carotid, renal, femoral bruits. DP and PT symmetrical and equal Extremities:  anasarca Neurologic:  sedated Skin:  Intact without significant lesions or rashes.   Intake/Output from previous day: 05/26 0701 - 05/27 0700 In: 1761.8 [I.V.:931.8; NG/GT:780; IV Piggyback:50] Out: 2800 [Urine:2800] Intake/Output this shift: Total I/O In: 107.5 [I.V.:52.5; NG/GT:55] Out: 100 [Urine:100]  Lab Results:  Recent Labs  09/21/14 0359 09/21/14 1700 09/22/14 0427  WBC 7.0 8.6 9.2  HGB 7.4* 7.5* 7.2*  HCT 23.7* 24.0* 23.2*  PLT 211 236 240   BMET  Recent Labs  09/21/14 0359 09/21/14 1700 09/22/14 0427  NA 146* 146* 142  K 4.7 4.9 4.7  CL 112* 111 112*  CO2 20* 21* 21*  GLUCOSE 119* 200* 261*  BUN 141* 143* 154*  CREATININE 7.03* 6.83* 6.73*  CALCIUM 8.7* 9.1 8.7*   LFT No results for input(s): PROT, ALBUMIN, AST, ALT, ALKPHOS, BILITOT, BILIDIR, IBILI in the last 72  hours. PT/INR No results for input(s): LABPROT, INR in the last 72 hours. Hepatitis Panel No results for input(s): HEPBSAG, HCVAB, HEPAIGM, HEPBIGM in the last 72 hours.  Studies/Results: Dg Chest Port 1 View  09/22/2014   CLINICAL DATA:  Acute respiratory failure, community-acquired pneumonia, sepsis, history of diabetes and remote history of heavy tobacco use.  EXAM: PORTABLE CHEST - 1 VIEW  COMPARISON:  Portable chest x-ray of Sep 21, 2014  FINDINGS: The lungs are adequately inflated. The interstitial markings remain mildly increased but have further improved since yesterday's study. The cardiopericardial silhouette remains enlarged. The pulmonary vascularity is not significantly engorged. The bony structures exhibit no acute abnormalities.  The endotracheal tube tip lies 5.1 cm above the carina. The esophagogastric tube tip projects below the inferior margin of the image. The left internal jugular venous catheter tip projects over the midportion of the SVC.  IMPRESSION: Slight improved aeration and decreased pulmonary interstitial edema bilaterally. Improved appearance of the left retrocardiac atelectasis or pneumonia. There is stable cardiomegaly without significant pulmonary vascular congestion. The support tubes and lines are in reasonable position.   Electronically Signed   By: David  [place] M.D.   On: 09/22/2014 07:24   Dg Chest Port 1 View  09/21/2014   CLINICAL DATA:  Endotracheal tube advancement.  EXAM: PORTABLE CHEST - 1 VIEW  COMPARISON:  Earlier the same date and 09/20/2014.  FINDINGS: 0928 hours. Endotracheal tube tip is in the midtrachea, 3.6 cm above the carina. Nasogastric tube extends into the stomach, tip not visualized. Left IJ central venous catheter tip is unchanged at the mid SVC level. There are persistent low lung volumes with mild bibasilar atelectasis and vascular congestion. No confluent airspace opacity or significant  pleural effusion identified. The heart size and  mediastinal contours are stable.  IMPRESSION: Endotracheal tube tip in the mid trachea. Otherwise stable examination.   Electronically Signed   By: Carey Bullocks M.D.   On: 09/21/2014 10:27   Dg Chest Port 1 View  09/21/2014   CLINICAL DATA:  Acute respiratory failure, community-acquired pneumonia, stage IV chronic renal insufficiency, CHF.  EXAM: PORTABLE CHEST - 1 VIEW  COMPARISON:  Portable chest x-ray of Sep 20, 2014  FINDINGS: The lungs are adequately inflated. The interstitial markings remain increased bilaterally. The cardiac silhouette remains enlarged. The pulmonary vascularity is engorged. The endotracheal tube tip lies 7.7 cm above the carina. The esophagogastric tube tip projects below the inferior margin of the image. The left internal jugular venous catheter tip projects over the mid SVC. There is no pleural effusion or pneumothorax. The bony thorax is unremarkable.  IMPRESSION: Mild pulmonary interstitial edema associated with cardiomegaly consistent with low-grade CHF. There has not been significant change since yesterday's study.   Electronically Signed   By: David  [place] M.D.   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

## 2014-09-23 ENCOUNTER — Inpatient Hospital Stay (HOSPITAL_COMMUNITY): Payer: Commercial Managed Care - HMO

## 2014-09-23 DIAGNOSIS — J80 Acute respiratory distress syndrome: Secondary | ICD-10-CM

## 2014-09-23 DIAGNOSIS — G9340 Encephalopathy, unspecified: Secondary | ICD-10-CM

## 2014-09-23 LAB — CBC
HEMATOCRIT: 23.5 % — AB (ref 39.0–52.0)
Hemoglobin: 7.2 g/dL — ABNORMAL LOW (ref 13.0–17.0)
MCH: 27.3 pg (ref 26.0–34.0)
MCHC: 30.6 g/dL (ref 30.0–36.0)
MCV: 89 fL (ref 78.0–100.0)
Platelets: 233 10*3/uL (ref 150–400)
RBC: 2.64 MIL/uL — ABNORMAL LOW (ref 4.22–5.81)
RDW: 14.7 % (ref 11.5–15.5)
WBC: 11.6 10*3/uL — AB (ref 4.0–10.5)

## 2014-09-23 LAB — GLUCOSE, CAPILLARY
GLUCOSE-CAPILLARY: 152 mg/dL — AB (ref 65–99)
GLUCOSE-CAPILLARY: 161 mg/dL — AB (ref 65–99)
GLUCOSE-CAPILLARY: 162 mg/dL — AB (ref 65–99)
GLUCOSE-CAPILLARY: 167 mg/dL — AB (ref 65–99)
GLUCOSE-CAPILLARY: 192 mg/dL — AB (ref 65–99)
GLUCOSE-CAPILLARY: 224 mg/dL — AB (ref 65–99)
Glucose-Capillary: 133 mg/dL — ABNORMAL HIGH (ref 65–99)

## 2014-09-23 LAB — BASIC METABOLIC PANEL
ANION GAP: 10 (ref 5–15)
Anion gap: 9 (ref 5–15)
BUN: 109 mg/dL — ABNORMAL HIGH (ref 6–20)
BUN: 81 mg/dL — ABNORMAL HIGH (ref 6–20)
CALCIUM: 8.6 mg/dL — AB (ref 8.9–10.3)
CHLORIDE: 107 mmol/L (ref 101–111)
CO2: 25 mmol/L (ref 22–32)
CO2: 27 mmol/L (ref 22–32)
CREATININE: 3.61 mg/dL — AB (ref 0.61–1.24)
Calcium: 8.3 mg/dL — ABNORMAL LOW (ref 8.9–10.3)
Chloride: 103 mmol/L (ref 101–111)
Creatinine, Ser: 4.73 mg/dL — ABNORMAL HIGH (ref 0.61–1.24)
GFR calc Af Amer: 14 mL/min — ABNORMAL LOW (ref >60)
GFR calc Af Amer: 19 mL/min — ABNORMAL LOW (ref >60)
GFR calc non Af Amer: 12 mL/min — ABNORMAL LOW (ref >60)
GFR calc non Af Amer: 16 mL/min — ABNORMAL LOW (ref >60)
GLUCOSE: 185 mg/dL — AB (ref 65–99)
Glucose, Bld: 156 mg/dL — ABNORMAL HIGH (ref 65–99)
POTASSIUM: 4.8 mmol/L (ref 3.5–5.1)
Potassium: 5.4 mmol/L — ABNORMAL HIGH (ref 3.5–5.1)
Sodium: 141 mmol/L (ref 135–145)
Sodium: 140 mmol/L (ref 135–145)

## 2014-09-23 MED ORDER — PRISMASOL BGK 4/2.5 32-4-2.5 MEQ/L IV SOLN
INTRAVENOUS | Status: DC
  Administered 2014-09-23 – 2014-09-25 (×4): via INTRAVENOUS_CENTRAL
  Filled 2014-09-23 (×6): qty 5000

## 2014-09-23 MED ORDER — DOCUSATE SODIUM 50 MG/5ML PO LIQD
50.0000 mg | Freq: Every day | ORAL | Status: DC | PRN
  Filled 2014-09-23: qty 10

## 2014-09-23 MED ORDER — PRISMASOL BGK 0/2.5 32-2.5 MEQ/L IV SOLN
INTRAVENOUS | Status: DC
  Administered 2014-09-23: 10:00:00 via INTRAVENOUS_CENTRAL
  Filled 2014-09-23 (×2): qty 5000

## 2014-09-23 MED ORDER — PRISMASOL BGK 0/2.5 32-2.5 MEQ/L IV SOLN
INTRAVENOUS | Status: DC
  Administered 2014-09-23: 10:00:00 via INTRAVENOUS_CENTRAL
  Filled 2014-09-23 (×3): qty 5000

## 2014-09-23 MED ORDER — PRISMASOL BGK 4/2.5 32-4-2.5 MEQ/L IV SOLN
INTRAVENOUS | Status: DC
  Administered 2014-09-23 – 2014-09-24 (×2): via INTRAVENOUS_CENTRAL
  Filled 2014-09-23 (×4): qty 5000

## 2014-09-23 NOTE — Progress Notes (Signed)
PULMONARY / CRITICAL CARE MEDICINE HISTORY AND PHYSICAL EXAMINATION   Name: Roger Ashley MRN: [id number] DOB: October 18, 1945    ADMISSION DATE:  09/16/2014  PRIMARY SERVICE: PCCM  CHIEF COMPLAINT:  SOB  BRIEF PATIENT DESCRIPTION: 36 M with EF 40-45%, CKD 4, DM, HTN who presented to [facility] with worsening SOB 2/2 ARDS vs aCHF and eventually required intubation. Transferred to [facility] for further management.   SIGNIFICANT EVENTS / STUDIES:  5/20: Admit with respiratory distress, ABG on admission 7.336/36.2/69.2 (on BiPAP).  Intubated, ARDS protocol  5/22: Not synchronous on vent, CXR improved.  ARDS protocol d/c'd, f/u ABG >> 7.15/ 63/79/22.  Nimbex gtt started. Poorly controlled CBGs --> transitioned to resistant SSI 5/23: Off Nimbex and Levo during the day, Levo restarted overnight briefly. Pt able to open eyes to name. 5/25: Still with volume on board. Lasix given. Off paralytic, agitated off sedation.  5/26: Worsening renal fx but with good uop.  5/27 >renal consult >CRRT started   SUBJECTIVE: Continues to have good uop, neg balance. Renal consult yesterday , CRRT started  Weaned well on 5/27,  Unable wean this am , lethargic , sedation decreased  Opens eyes, not following commands  Constipation resolved with miralax/colace now with loose stools.   VITAL SIGNS: Temp:  [97.5 F (36.4 C)-98.7 F (37.1 C)] 98 F (36.7 C) (05/28 0742) Pulse Rate:  [67-97] 79 (05/28 0719) Resp:  [0-22] 15 (05/28 0719) BP: (81-166)/(44-123) 108/51 mmHg (05/28 0719) SpO2:  [99 %-100 %] 100 % (05/28 0719) FiO2 (%):  [40 %] 40 % (05/28 0719) Weight:  [206 lb 2.1 oz (93.5 kg)] 206 lb 2.1 oz (93.5 kg) (05/28 0400)   HEMODYNAMICS: CVP:  [13 mmHg] 13 mmHg   VENTILATOR SETTINGS: Vent Mode:  [-] PRVC FiO2 (%):  [40 %] 40 % Set Rate:  [15 bmp] 15 bmp Vt Set:  [510 mL] 510 mL PEEP:  [5 cmH20] 5 cmH20 Pressure Support:  [5 cmH20-8 cmH20] 8 cmH20 Plateau Pressure:  [20 cmH20-28 cmH20] 20 cmH20   INTAKE /  OUTPUT: Intake/Output      05/27 0701 - 05/28 0700 05/28 0701 - 05/29 0700   I.V. (mL/kg) 286.2 (3.1)    NG/GT 555    IV Piggyback     Total Intake(mL/kg) 841.2 (9)    Urine (mL/kg/hr) 1225 (0.5)    Other 1262 (0.6) 100 (0.5)   Total Output 2487 100   Net -1645.8 -100          PHYSICAL EXAMINATION: General: NAD, Intubated HEENT: OETT in place, jvd down Neck: Trachea supple and midline Cardiovascular:  RRR, continuous murmur Lungs: CTAB,  Abdomen: S, NT, ND Musculoskeletal: No acute deformities  Skin: Warm/dry, no appreciable edema. LUE AVF, bruit and thrill present.  Neuro: Minimal sedation, able to open eye on command.  LABS:  CBC  Recent Labs Lab 09/21/14 1700 09/22/14 0427 09/23/14 0430  WBC 8.6 9.2 11.6*  HGB 7.5* 7.2* 7.2*  HCT 24.0* 23.2* 23.5*  PLT 236 240 233   Coag's  Recent Labs Lab 09/22/14 1300  INR 1.29     BMET  Recent Labs Lab 09/22/14 0427 09/22/14 1630 09/23/14 0430  NA 142 148* 141  K 4.7 5.3* 5.4*  CL 112* 116* 107  CO2 21* 23 25  BUN 154* 158* 109*  CREATININE 6.73* 6.79* 4.73*  GLUCOSE 261* 214* 185*   Electrolytes  Recent Labs Lab 09/17/14 0300  09/22/14 0427 09/22/14 1630 09/23/14 0430  CALCIUM  --   < >  8.7* 8.8* 8.3*  MG 1.9  --   --   --   --   PHOS 6.8*  --   --   --   --   < > = values in this interval not displayed. Sepsis Markers  Recent Labs Lab 09/17/14 1100 09/18/14 0330 09/19/14 0353  PROCALCITON 62.06 56.05 45.06   ABG  Recent Labs Lab 09/17/14 0901 09/17/14 1305 09/18/14 0443  PHART 7.155* 7.301* 7.409  PCO2ART 63.5* 40.1 30.4*  PO2ART 79.0* 177.0* 72.5*   Liver Enzymes  Recent Labs Lab 09/18/14 0330  AST 17  ALT 16*  ALKPHOS 53  BILITOT 0.7  ALBUMIN 2.3*     Cardiac Enzymes  Recent Labs Lab 09/17/14 1453 09/18/14 0330  TROPONINI 0.10* 0.08*   Glucose  Recent Labs Lab 09/22/14 1135 09/22/14 1607 09/22/14 2015 09/22/14 2331 09/23/14 0403 09/23/14 0740  GLUCAP  198* 196* 188* 224* 161* 133*    Imaging Dg Chest Port 1 View  09/23/2014   CLINICAL DATA:  Pneumonia  EXAM: PORTABLE CHEST - 1 VIEW  COMPARISON:  09/22/2014  FINDINGS: Right IJ hemodialysis catheter terminates of the distal SVC. Left IJ central venous catheter is noted with tip over the distal SVC. No pneumothorax. Lungs are hypoaerated with crowding of the bronchovascular markings. Patchy bibasilar opacities have improved along with mild improvement in overall aeration. No significant pleural effusion. No acute osseous finding.  IMPRESSION: Slight improvement in bibasilar aeration.   Electronically Signed   By: Christiana Pellant M.D.   On: 09/23/2014 07:31   Dg Chest Port 1 View  09/22/2014   CLINICAL DATA:  Hypoxia  EXAM: PORTABLE CHEST - 1 VIEW  COMPARISON:  Study obtained earlier in the day  FINDINGS: Endotracheal tube tip is 6.2 cm above the carina. Each central catheter tip is in the superior vena cava. Nasogastric tube tip and side port are in the stomach. No pneumothorax. There is slight bibasilar atelectatic change. Lungs are otherwise clear. Heart is mildly enlarged with pulmonary vascularity within normal limits. No adenopathy. There is arthropathy in each shoulder.  IMPRESSION: Tube and catheter positions as described without pneumothorax. Slight bibasilar atelectatic change. No edema or consolidation. No change in cardiac silhouette.   Electronically Signed   By: Bretta Bang III M.D.   On: 09/22/2014 15:28   Dg Chest Port 1 View  09/22/2014   CLINICAL DATA:  Status post dialysis catheter insertion  EXAM: PORTABLE CHEST - 1 VIEW  COMPARISON:  09/21/2014  FINDINGS: ET tube tip is above the carina. There is a left IJ catheter with tip in the projection of the SVC. Right IJ catheter tip is in the cavoatrial junction. No pneumothorax identified. Heart size is normal. Atelectasis is noted in both lung bases.  IMPRESSION: 1. No pneumothorax after placement of right IJ catheter. The tip is in  the cavoatrial junction. 2. Bibasilar atelectasis noted.   Electronically Signed   By: Signa Kell M.D.   On: 09/22/2014 15:21   Dg Chest Port 1 View  09/22/2014   CLINICAL DATA:  Acute respiratory failure, community-acquired pneumonia, sepsis, history of diabetes and remote history of heavy tobacco use.  EXAM: PORTABLE CHEST - 1 VIEW  COMPARISON:  Portable chest x-ray of Sep 21, 2014  FINDINGS: The lungs are adequately inflated. The interstitial markings remain mildly increased but have further improved since yesterday's study. The cardiopericardial silhouette remains enlarged. The pulmonary vascularity is not significantly engorged. The bony structures exhibit no acute abnormalities.  The  endotracheal tube tip lies 5.1 cm above the carina. The esophagogastric tube tip projects below the inferior margin of the image. The left internal jugular venous catheter tip projects over the midportion of the SVC.  IMPRESSION: Slight improved aeration and decreased pulmonary interstitial edema bilaterally. Improved appearance of the left retrocardiac atelectasis or pneumonia. There is stable cardiomegaly without significant pulmonary vascular congestion. The support tubes and lines are in reasonable position.   Electronically Signed   By: David  [place] M.D.   On: 09/22/2014 07:24   Dg Chest Port 1 View  09/21/2014   CLINICAL DATA:  Endotracheal tube advancement.  EXAM: PORTABLE CHEST - 1 VIEW  COMPARISON:  Earlier the same date and 09/20/2014.  FINDINGS: 0928 hours. Endotracheal tube tip is in the midtrachea, 3.6 cm above the carina. Nasogastric tube extends into the stomach, tip not visualized. Left IJ central venous catheter tip is unchanged at the mid SVC level. There are persistent low lung volumes with mild bibasilar atelectasis and vascular congestion. No confluent airspace opacity or significant pleural effusion identified. The heart size and mediastinal contours are stable.  IMPRESSION: Endotracheal tube tip  in the mid trachea. Otherwise stable examination.   Electronically Signed   By: Carey Bullocks M.D.   On: 09/21/2014 10:27   Dg Abd Portable 1v  09/22/2014   CLINICAL DATA:  Vascular dialysis catheter in place.  EXAM: PORTABLE ABDOMEN - 1 VIEW  COMPARISON:  09/17/2014  FINDINGS: Nasogastric tube has been pulled back since prior study now projecting in the mid stomach, well positioned.  Normal bowel gas pattern.  No evidence of bowel obstruction.  No evidence of renal or ureteral stones. Soft tissues are unremarkable.  Scattered areas of bony sclerosis, stable from prior exams. This could reflect blastic metastatic disease. It may be related to renal osteodystrophy.  IMPRESSION: 1. No acute finding.   Electronically Signed   By: Amie Portland M.D.   On: 09/22/2014 14:40    ASSESSMENT / PLAN:  PULMONARY A: Acute hypoxemic respiratory failure - DDx includes ARDS vs acute heart failure.  BNP 547 on admit.  Diffuse Bilateral Airspace Disease  ARDS P:   Wean as tolerated  May need improved neurostatus for extubation success pcxr appears stable and improved aeratrion   CARDIOVASCULAR L IJ TLC 5/20 >>  A: Chronic Systolic HF- Per chart review most care at [facility].  H/o HTN Off pressors since 5/25 P:   Labetalol BID Hydralazine ICU monitoring  ASA   RENAL A: AKI on CKD - Likely 2/2 aggressive diuresis and ATN>>CRRT started 5/27   Has inmature fistula P:   CRRT per  Renal  Monitor electrolytes   GASTROINTESTINAL A: Vent Associated Dysphagia   P:  Continue TF: Oxepa PPI  Bowel regimen: Colace, Mirilax  HEMATOLOGIC A: Anemia - Likely AOCD Hgb slowly trending down to 7.2, no active bleeding P:   CBC in AM DVT PPx: [place] Heparin  INFECTIOUS Sputum Culture 5/21 >> Blood Culture x 2 5/20 >> Strep Pneumo 5/22 >> neg  pCXR with increased infiltrate on left  A: HCAP vs CAP --> initially on broad coverage due to unclear medical background Narrowed off Vanc and Cefepime 5/23 P:    Urine Legionella Ag - neg Vanco, start date 5/21>>>5/23 Cefepime, start date 5/21>>>5/23  Azithro, start date 5/20>>> 5/25 Ceftriaxone, start date 5/23>> stop 29th  ENDOCRINE A: DM Improved CBGs with resistant SSI and addition of Lantus P:   Resistant SSI Continue Lantus 5u   NEUROLOGIC  A: ICU Associated Discomfort Delirium  Minimal sedation, opens eyes on WUA P:  Monitor neuro status  Off Propofol WUA Fentanyl gtt for pain     FAMILY: Family updated by Dr. Tyson Alias 5/23, 24th, and by resident 5/25, 5/28 wife updated at bedside   Tammy Parrett NP-C  [facility]  [phone]    Attending:  I have seen and examined the patient with nurse practitioner/resident and agree with the note above.   Mr Vanostrand is improving.  Lungs with a few crackles on exam, vent supported breaths.  Sedated on vent.  CXR reviewed > cardiomegaly improved infiltrates  Acute respiratory failure with hypoxemia> ARDS/pulm edema > improving, continue to wean vent Acute encephalopathy> due to sedation mostly at this point, wean off fentanyl, no benzo  Wife updated bedside by me  My cc time 35 minutes  Heber [place], MD [place] [facility] Pager: [phone] Cell: [phone] After 3pm or if no response, call [phone]

## 2014-09-23 NOTE — Progress Notes (Signed)
[facility] ROUNDING NOTE   Subjective:   Interval History:  Tolerating CVVHDF well   Objective:  Vital signs in last 24 hours:  Temp:  [97.5 F (36.4 C)-98.7 F (37.1 C)] 98 F (36.7 C) (05/28 0742) Pulse Rate:  [67-97] 79 (05/28 0719) Resp:  [0-22] 15 (05/28 0719) BP: (81-166)/(44-123) 108/51 mmHg (05/28 0719) SpO2:  [99 %-100 %] 100 % (05/28 0719) FiO2 (%):  [40 %] 40 % (05/28 0719) Weight:  [93.5 kg (206 lb 2.1 oz)] 93.5 kg (206 lb 2.1 oz) (05/28 0400)  Weight change: -3.7 kg (-8 lb 2.5 oz) Filed Weights   09/20/14 0339 09/22/14 0420 09/23/14 0400  Weight: 98.8 kg (217 lb 13 oz) 97.2 kg (214 lb 4.6 oz) 93.5 kg (206 lb 2.1 oz)    Intake/Output: I/O last 3 completed shifts: In: 1762.9 [I.V.:747.9; NG/GT:1015] Out: 3812 [Urine:2550; Other:1262]   Intake/Output this shift:     CVS- RRR RS- CTA ABD- BS present soft non-distended EXT- 2-3 + edema   Basic Metabolic Panel:  Recent Labs Lab 09/17/14 0300  09/21/14 0359 09/21/14 1700 09/22/14 0427 09/22/14 1630 09/23/14 0430  NA  --   < > 146* 146* 142 148* 141  K  --   < > 4.7 4.9 4.7 5.3* 5.4*  CL  --   < > 112* 111 112* 116* 107  CO2  --   < > 20* 21* 21* 23 25  GLUCOSE  --   < > 119* 200* 261* 214* 185*  BUN  --   < > 141* 143* 154* 158* 109*  CREATININE  --   < > 7.03* 6.83* 6.73* 6.79* 4.73*  CALCIUM  --   < > 8.7* 9.1 8.7* 8.8* 8.3*  MG 1.9  --   --   --   --   --   --   PHOS 6.8*  --   --   --   --   --   --   < > = values in this interval not displayed.  Liver Function Tests:  Recent Labs Lab 09/18/14 0330  AST 17  ALT 16*  ALKPHOS 53  BILITOT 0.7  PROT 6.3*  ALBUMIN 2.3*   No results for input(s): LIPASE, AMYLASE in the last 168 hours. No results for input(s): AMMONIA in the last 168 hours.  CBC:  Recent Labs Lab 09/20/14 0350 09/21/14 0359 09/21/14 1700 09/22/14 0427 09/23/14 0430  WBC 6.4 7.0 8.6 9.2 11.6*  HGB 8.1* 7.4* 7.5* 7.2* 7.2*  HCT 26.6* 23.7* 24.0*  23.2* 23.5*  MCV 87.2 86.5 86.3 86.2 89.0  PLT 215 211 236 240 233    Cardiac Enzymes:  Recent Labs Lab 09/17/14 1453 09/18/14 0330  TROPONINI 0.10* 0.08*    BNP: Invalid input(s): POCBNP  CBG:  Recent Labs Lab 09/22/14 1135 09/22/14 1607 09/22/14 2015 09/22/14 2331 09/23/14 0403  GLUCAP 198* 196* 188* 224* 161*    Microbiology: Results for orders placed or performed during the hospital encounter of 08/31/2014  MRSA PCR Screening     Status: None   Collection Time: 09/20/2014  4:00 PM  Result Value Ref Range Status   MRSA by PCR NEGATIVE NEGATIVE Final    Comment:        The GeneXpert MRSA Assay (FDA approved for NASAL specimens only), is one component of a comprehensive MRSA colonization surveillance program. It is not intended to diagnose MRSA infection nor to guide or monitor treatment for MRSA infections.   Culture,  blood (routine x 2) Call MD if unable to obtain prior to antibiotics being given     Status: None   Collection Time: 09/19/2014  6:15 PM  Result Value Ref Range Status   Specimen Description BLOOD RIGHT HAND  Final   Special Requests BOTTLES DRAWN AEROBIC AND ANAEROBIC 6CC  Final   Culture NO GROWTH 5 DAYS  Final   Report Status 09/20/2014 FINAL  Final  Culture, blood (routine x 2) Call MD if unable to obtain prior to antibiotics being given     Status: None   Collection Time: 09/26/2014  6:20 PM  Result Value Ref Range Status   Specimen Description BLOOD RIGHT ARM  Final   Special Requests BOTTLES DRAWN AEROBIC AND ANAEROBIC 6CC  Final   Culture NO GROWTH 5 DAYS  Final   Report Status 09/20/2014 FINAL  Final    Coagulation Studies:  Recent Labs  09/22/14 1300  LABPROT 16.3*  INR 1.29    Urinalysis: No results for input(s): COLORURINE, LABSPEC, PHURINE, GLUCOSEU, HGBUR, BILIRUBINUR, KETONESUR, PROTEINUR, UROBILINOGEN, NITRITE, LEUKOCYTESUR in the last 72 hours.  Invalid input(s): APPERANCEUR    Imaging: Dg Chest Port 1  View  09/23/2014   CLINICAL DATA:  Pneumonia  EXAM: PORTABLE CHEST - 1 VIEW  COMPARISON:  09/22/2014  FINDINGS: Right IJ hemodialysis catheter terminates of the distal SVC. Left IJ central venous catheter is noted with tip over the distal SVC. No pneumothorax. Lungs are hypoaerated with crowding of the bronchovascular markings. Patchy bibasilar opacities have improved along with mild improvement in overall aeration. No significant pleural effusion. No acute osseous finding.  IMPRESSION: Slight improvement in bibasilar aeration.   Electronically Signed   By: Christiana Pellant M.D.   On: 09/23/2014 07:31   Dg Chest Port 1 View  09/22/2014   CLINICAL DATA:  Hypoxia  EXAM: PORTABLE CHEST - 1 VIEW  COMPARISON:  Study obtained earlier in the day  FINDINGS: Endotracheal tube tip is 6.2 cm above the carina. Each central catheter tip is in the superior vena cava. Nasogastric tube tip and side port are in the stomach. No pneumothorax. There is slight bibasilar atelectatic change. Lungs are otherwise clear. Heart is mildly enlarged with pulmonary vascularity within normal limits. No adenopathy. There is arthropathy in each shoulder.  IMPRESSION: Tube and catheter positions as described without pneumothorax. Slight bibasilar atelectatic change. No edema or consolidation. No change in cardiac silhouette.   Electronically Signed   By: Bretta Bang III M.D.   On: 09/22/2014 15:28   Dg Chest Port 1 View  09/22/2014   CLINICAL DATA:  Status post dialysis catheter insertion  EXAM: PORTABLE CHEST - 1 VIEW  COMPARISON:  09/21/2014  FINDINGS: ET tube tip is above the carina. There is a left IJ catheter with tip in the projection of the SVC. Right IJ catheter tip is in the cavoatrial junction. No pneumothorax identified. Heart size is normal. Atelectasis is noted in both lung bases.  IMPRESSION: 1. No pneumothorax after placement of right IJ catheter. The tip is in the cavoatrial junction. 2. Bibasilar atelectasis noted.    Electronically Signed   By: Signa Kell M.D.   On: 09/22/2014 15:21   Dg Chest Port 1 View  09/22/2014   CLINICAL DATA:  Acute respiratory failure, community-acquired pneumonia, sepsis, history of diabetes and remote history of heavy tobacco use.  EXAM: PORTABLE CHEST - 1 VIEW  COMPARISON:  Portable chest x-ray of Sep 21, 2014  FINDINGS: The lungs are adequately  inflated. The interstitial markings remain mildly increased but have further improved since yesterday's study. The cardiopericardial silhouette remains enlarged. The pulmonary vascularity is not significantly engorged. The bony structures exhibit no acute abnormalities.  The endotracheal tube tip lies 5.1 cm above the carina. The esophagogastric tube tip projects below the inferior margin of the image. The left internal jugular venous catheter tip projects over the midportion of the SVC.  IMPRESSION: Slight improved aeration and decreased pulmonary interstitial edema bilaterally. Improved appearance of the left retrocardiac atelectasis or pneumonia. There is stable cardiomegaly without significant pulmonary vascular congestion. The support tubes and lines are in reasonable position.   Electronically Signed   By: David  [place] M.D.   On: 09/22/2014 07:24   Dg Chest Port 1 View  09/21/2014   CLINICAL DATA:  Endotracheal tube advancement.  EXAM: PORTABLE CHEST - 1 VIEW  COMPARISON:  Earlier the same date and 09/20/2014.  FINDINGS: 0928 hours. Endotracheal tube tip is in the midtrachea, 3.6 cm above the carina. Nasogastric tube extends into the stomach, tip not visualized. Left IJ central venous catheter tip is unchanged at the mid SVC level. There are persistent low lung volumes with mild bibasilar atelectasis and vascular congestion. No confluent airspace opacity or significant pleural effusion identified. The heart size and mediastinal contours are stable.  IMPRESSION: Endotracheal tube tip in the mid trachea. Otherwise stable examination.    Electronically Signed   By: Carey Bullocks M.D.   On: 09/21/2014 10:27   Dg Abd Portable 1v  09/22/2014   CLINICAL DATA:  Vascular dialysis catheter in place.  EXAM: PORTABLE ABDOMEN - 1 VIEW  COMPARISON:  09/17/2014  FINDINGS: Nasogastric tube has been pulled back since prior study now projecting in the mid stomach, well positioned.  Normal bowel gas pattern.  No evidence of bowel obstruction.  No evidence of renal or ureteral stones. Soft tissues are unremarkable.  Scattered areas of bony sclerosis, stable from prior exams. This could reflect blastic metastatic disease. It may be related to renal osteodystrophy.  IMPRESSION: 1. No acute finding.   Electronically Signed   By: Amie Portland M.D.   On: 09/22/2014 14:40     Medications:   . dextrose Stopped (09/22/14 0900)  . fentaNYL infusion INTRAVENOUS 100 mcg/hr (09/23/14 0700)  . dialysis replacement fluid (prismasate)    . dialysis replacement fluid (prismasate)    . dialysate (PRISMASATE) 2,000 mL/hr at 09/23/14 1610  . propofol (DIPRIVAN) infusion Stopped (09/21/14 0631)   . antiseptic oral rinse  7 mL Mouth Rinse QID  . aspirin  81 mg Per Tube Daily  . calcitRIOL  0.25 mcg Oral Q M,W,F-1800  . cefTRIAXone (ROCEPHIN)  IV  1 g Intravenous Q24H  . chlorhexidine  15 mL Mouth Rinse BID  . docusate  50 mg Oral Daily  . feeding supplement (OXEPA)  1,000 mL Per Tube Q24H  . feeding supplement (PRO-STAT SUGAR FREE 64)  60 mL Per Tube TID  . heparin  5,000 Units Subcutaneous 3 times per day  . hydrALAZINE  25 mg Oral 3 times per day  . insulin aspart  0-20 Units Subcutaneous 6 times per day  . insulin glargine  5 Units Subcutaneous QHS  . labetalol  200 mg Oral BID  . pantoprazole sodium  40 mg Per Tube Q1200  . polyethylene glycol  17 g Oral Daily  . 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

## 2014-09-23 NOTE — Progress Notes (Signed)
RT note: RT advanced ETT 2 cm from 25cm to 27cm at the lips per MD order.

## 2014-09-24 ENCOUNTER — Inpatient Hospital Stay (HOSPITAL_COMMUNITY): Payer: Commercial Managed Care - HMO

## 2014-09-24 DIAGNOSIS — E118 Type 2 diabetes mellitus with unspecified complications: Secondary | ICD-10-CM

## 2014-09-24 LAB — CBC
HCT: 24.3 % — ABNORMAL LOW (ref 39.0–52.0)
Hemoglobin: 7.4 g/dL — ABNORMAL LOW (ref 13.0–17.0)
MCH: 26.9 pg (ref 26.0–34.0)
MCHC: 30.5 g/dL (ref 30.0–36.0)
MCV: 88.4 fL (ref 78.0–100.0)
Platelets: 280 10*3/uL (ref 150–400)
RBC: 2.75 MIL/uL — AB (ref 4.22–5.81)
RDW: 14.5 % (ref 11.5–15.5)
WBC: 12.9 10*3/uL — AB (ref 4.0–10.5)

## 2014-09-24 LAB — RENAL FUNCTION PANEL
ANION GAP: 10 (ref 5–15)
Albumin: 2.5 g/dL — ABNORMAL LOW (ref 3.5–5.0)
BUN: 67 mg/dL — AB (ref 6–20)
CO2: 25 mmol/L (ref 22–32)
CREATININE: 3.42 mg/dL — AB (ref 0.61–1.24)
Calcium: 8.8 mg/dL — ABNORMAL LOW (ref 8.9–10.3)
Chloride: 101 mmol/L (ref 101–111)
GFR, EST AFRICAN AMERICAN: 20 mL/min — AB (ref >60)
GFR, EST NON AFRICAN AMERICAN: 17 mL/min — AB (ref >60)
Glucose, Bld: 181 mg/dL — ABNORMAL HIGH (ref 65–99)
PHOSPHORUS: 5.3 mg/dL — AB (ref 2.5–4.6)
Potassium: 4.6 mmol/L (ref 3.5–5.1)
SODIUM: 136 mmol/L (ref 135–145)

## 2014-09-24 LAB — BASIC METABOLIC PANEL
Anion gap: 8 (ref 5–15)
BUN: 67 mg/dL — ABNORMAL HIGH (ref 6–20)
CO2: 28 mmol/L (ref 22–32)
Calcium: 8.6 mg/dL — ABNORMAL LOW (ref 8.9–10.3)
Chloride: 101 mmol/L (ref 101–111)
Creatinine, Ser: 3.12 mg/dL — ABNORMAL HIGH (ref 0.61–1.24)
GFR calc non Af Amer: 19 mL/min — ABNORMAL LOW (ref >60)
GFR, EST AFRICAN AMERICAN: 22 mL/min — AB (ref >60)
GLUCOSE: 119 mg/dL — AB (ref 65–99)
Potassium: 4.7 mmol/L (ref 3.5–5.1)
SODIUM: 137 mmol/L (ref 135–145)

## 2014-09-24 LAB — GLUCOSE, CAPILLARY
GLUCOSE-CAPILLARY: 126 mg/dL — AB (ref 65–99)
GLUCOSE-CAPILLARY: 138 mg/dL — AB (ref 65–99)
GLUCOSE-CAPILLARY: 143 mg/dL — AB (ref 65–99)
Glucose-Capillary: 214 mg/dL — ABNORMAL HIGH (ref 65–99)
Glucose-Capillary: 158 mg/dL — ABNORMAL HIGH (ref 65–99)

## 2014-09-24 LAB — AMMONIA: Ammonia: 27 umol/L (ref 9–35)

## 2014-09-24 LAB — PHOSPHORUS: Phosphorus: 4.8 mg/dL — ABNORMAL HIGH (ref 2.5–4.6)

## 2014-09-24 LAB — VITAMIN B12: VITAMIN B 12: 1309 pg/mL — AB (ref 180–914)

## 2014-09-24 LAB — TRIGLYCERIDES: Triglycerides: 293 mg/dL — ABNORMAL HIGH (ref <150)

## 2014-09-24 LAB — MAGNESIUM: Magnesium: 2.8 mg/dL — ABNORMAL HIGH (ref 1.7–2.4)

## 2014-09-24 NOTE — Progress Notes (Signed)
Placed pt back on full support to transport on vent to CT and back

## 2014-09-24 NOTE — Progress Notes (Signed)
PULMONARY / CRITICAL CARE MEDICINE HISTORY AND PHYSICAL EXAMINATION   Name: Roger Ashley MRN: [id number] DOB: 05-29-1945    ADMISSION DATE:  09/23/2014  PRIMARY SERVICE: PCCM  CHIEF COMPLAINT:  SOB  BRIEF PATIENT DESCRIPTION: 61 M with EF 40-45%, CKD 4, DM, HTN who presented to [facility] with worsening SOB 2/2 ARDS vs aCHF and eventually required intubation. Transferred to [facility] for further management.   SIGNIFICANT EVENTS / STUDIES:  5/20: Admit with respiratory distress, ABG on admission 7.336/36.2/69.2 (on BiPAP).  Intubated, ARDS protocol  5/22: Not synchronous on vent, CXR improved.  ARDS protocol d/c'd, f/u ABG >> 7.15/ 63/79/22.  Nimbex gtt started. Poorly controlled CBGs --> transitioned to resistant SSI 5/23: Off Nimbex and Levo during the day, Levo restarted overnight briefly. Pt able to open eyes to name. 5/25: Still with volume on board. Lasix given. Off paralytic, agitated off sedation.  5/26: Worsening renal fx but with good uop.  5/27 >renal consult >CRRT started   SUBJECTIVE: Neg I/O Bal with CRRT ,scr tr down, UOP tr down  Did not wean yesterday  Off sedation this am, not following commands, opens eye spontaneously    VITAL SIGNS: Temp:  [97.4 F (36.3 C)-99.4 F (37.4 C)] 99.1 F (37.3 C) (05/29 0739) Pulse Rate:  [74-88] 85 (05/29 0700) Resp:  [10-22] 15 (05/29 0700) BP: (93-152)/(45-122) 140/61 mmHg (05/29 0700) SpO2:  [100 %] 100 % (05/29 0700) FiO2 (%):  [40 %] 40 % (05/29 0600) Weight:  [203 lb 11.3 oz (92.4 kg)] 203 lb 11.3 oz (92.4 kg) (05/29 0400)   HEMODYNAMICS:     VENTILATOR SETTINGS: Vent Mode:  [-] PRVC FiO2 (%):  [40 %] 40 % Set Rate:  [15 bmp] 15 bmp Vt Set:  [510 mL] 510 mL PEEP:  [5 cmH20] 5 cmH20 Pressure Support:  [5 cmH20] 5 cmH20 Plateau Pressure:  [13 cmH20-17 cmH20] 13 cmH20   INTAKE / OUTPUT: Intake/Output      05/28 0701 - 05/29 0700 05/29 0701 - 05/30 0700   I.V. (mL/kg) 44.9 (0.5)    NG/GT 620    Total Intake(mL/kg) 664.9  (7.2)    Urine (mL/kg/hr) 50 (0)    Other 2967 (1.3)    Total Output 3017     Net -2352.1            PHYSICAL EXAMINATION: General: NAD, Intubated HEENT: OETT in place, jvd down Neck: Trachea supple and midline Cardiovascular:  RRR, continuous murmur Lungs: CTAB,  Abdomen: S, NT, ND Musculoskeletal: No acute deformities  Skin: Warm/dry, no appreciable edema. LUE AVF, bruit and thrill present.  Neuro: no sedation, not following commands , maew x 4   LABS:  CBC  Recent Labs Lab 09/22/14 0427 09/23/14 0430 09/24/14 0515  WBC 9.2 11.6* 12.9*  HGB 7.2* 7.2* 7.4*  HCT 23.2* 23.5* 24.3*  PLT 240 233 280   Coag's  Recent Labs Lab 09/22/14 1300  INR 1.29     BMET  Recent Labs Lab 09/23/14 0430 09/23/14 1640 09/24/14 0515  NA 141 140 137  K 5.4* 4.8 4.7  CL 107 103 101  CO2 BUN 109* 81* 67*  CREATININE 4.73* 3.61* 3.12*  GLUCOSE 185* 156* 119*   Electrolytes  Recent Labs Lab 09/23/14 0430 09/23/14 1640 09/24/14 0515 09/24/14 0524  CALCIUM 8.3* 8.6* 8.6*  --   MG  --   --   --  2.8*  PHOS  --   --   --  4.8*  Sepsis Markers  Recent Labs Lab 09/17/14 1100 09/18/14 0330 09/19/14 0353  PROCALCITON 62.06 56.05 45.06   ABG  Recent Labs Lab 09/17/14 0901 09/17/14 1305 09/18/14 0443  PHART 7.155* 7.301* 7.409  PCO2ART 63.5* 40.1 30.4*  PO2ART 79.0* 177.0* 72.5*   Liver Enzymes  Recent Labs Lab 09/18/14 0330  AST 17  ALT 16*  ALKPHOS 53  BILITOT 0.7  ALBUMIN 2.3*     Cardiac Enzymes  Recent Labs Lab 09/17/14 1453 09/18/14 0330  TROPONINI 0.10* 0.08*   Glucose  Recent Labs Lab 09/23/14 0740 09/23/14 1227 09/23/14 1545 09/23/14 2036 09/23/14 2339 09/24/14 0327  GLUCAP 133* 167* 152* 192* 162* 126*    Imaging Dg Chest Port 1 View  09/24/2014   CLINICAL DATA:  Pneumonia  EXAM: PORTABLE CHEST - 1 VIEW  COMPARISON:  09/23/2014  FINDINGS: Endotracheal tube tip is in good position between the clavicular heads  and carina. Bilateral IJ central venous catheters, tips at the SVC level. The orogastric tube is in the stomach.  There is no edema, residual consolidation, effusion, or pneumothorax.  Normal heart size and mediastinal contours.  IMPRESSION: 1. Tubes and lines remain in good position. 2. Significantly improved aeration since admission.   Electronically Signed   By: Marnee Spring M.D.   On: 09/24/2014 06:08   Dg Chest Port 1 View  09/23/2014   CLINICAL DATA:  Pneumonia  EXAM: PORTABLE CHEST - 1 VIEW  COMPARISON:  09/22/2014  FINDINGS: Right IJ hemodialysis catheter terminates of the distal SVC. Left IJ central venous catheter is noted with tip over the distal SVC. No pneumothorax. Lungs are hypoaerated with crowding of the bronchovascular markings. Patchy bibasilar opacities have improved along with mild improvement in overall aeration. No significant pleural effusion. No acute osseous finding.  IMPRESSION: Slight improvement in bibasilar aeration.   Electronically Signed   By: Christiana Pellant M.D.   On: 09/23/2014 07:31   Dg Chest Port 1 View  09/22/2014   CLINICAL DATA:  Hypoxia  EXAM: PORTABLE CHEST - 1 VIEW  COMPARISON:  Study obtained earlier in the day  FINDINGS: Endotracheal tube tip is 6.2 cm above the carina. Each central catheter tip is in the superior vena cava. Nasogastric tube tip and side port are in the stomach. No pneumothorax. There is slight bibasilar atelectatic change. Lungs are otherwise clear. Heart is mildly enlarged with pulmonary vascularity within normal limits. No adenopathy. There is arthropathy in each shoulder.  IMPRESSION: Tube and catheter positions as described without pneumothorax. Slight bibasilar atelectatic change. No edema or consolidation. No change in cardiac silhouette.   Electronically Signed   By: Bretta Bang III M.D.   On: 09/22/2014 15:28   Dg Chest Port 1 View  09/22/2014   CLINICAL DATA:  Status post dialysis catheter insertion  EXAM: PORTABLE CHEST - 1  VIEW  COMPARISON:  09/21/2014  FINDINGS: ET tube tip is above the carina. There is a left IJ catheter with tip in the projection of the SVC. Right IJ catheter tip is in the cavoatrial junction. No pneumothorax identified. Heart size is normal. Atelectasis is noted in both lung bases.  IMPRESSION: 1. No pneumothorax after placement of right IJ catheter. The tip is in the cavoatrial junction. 2. Bibasilar atelectasis noted.   Electronically Signed   By: Signa Kell M.D.   On: 09/22/2014 15:21   Dg Abd Portable 1v  09/22/2014   CLINICAL DATA:  Vascular dialysis catheter in place.  EXAM: PORTABLE ABDOMEN - 1  VIEW  COMPARISON:  09/17/2014  FINDINGS: Nasogastric tube has been pulled back since prior study now projecting in the mid stomach, well positioned.  Normal bowel gas pattern.  No evidence of bowel obstruction.  No evidence of renal or ureteral stones. Soft tissues are unremarkable.  Scattered areas of bony sclerosis, stable from prior exams. This could reflect blastic metastatic disease. It may be related to renal osteodystrophy.  IMPRESSION: 1. No acute finding.   Electronically Signed   By: Amie Portland M.D.   On: 09/22/2014 14:40    ASSESSMENT / PLAN:  PULMONARY A: Acute hypoxemic respiratory failure - DDx includes ARDS vs acute heart failure.  BNP 547 on admit.  Diffuse Bilateral Airspace Disease >improved on CXR 5/29  ARDS P:   Wean as tolerated  Will need improved neurostatus for extubation success pcxr in am       CARDIOVASCULAR L IJ TLC 5/20 >>  A: Chronic Systolic HF- Per chart review [facility].  H/o HTN Off pressors since 5/25 P:   Labetalol BID Hydralazine Three times a day   ICU monitoring  ASA   RENAL A: AKI on CKD - Likely 2/2 aggressive diuresis and ATN>>CRRT started 5/27   Has inmature fistula 5/29 >scr tr down , UOP tr down , neg bal with CRRT  P:   CRRT per  Renal  Monitor electrolytes   GASTROINTESTINAL A: Vent Associated Dysphagia 5/29 >TF  residuals improved and BM improved , off miralax 5/28    P:  Continue TF: Oxepa   PPI  Bowel regimen: Colace As needed    HEMATOLOGIC A: Anemia - Likely AOCD Hgb stable 7.4 , no active bleeding  Jehovah witness and will not take blood   P:   CBC in AM DVT PPx: [place] Heparin  INFECTIOUS Sputum Culture 5/21 >>not done  Blood Culture x 2 5/20 >>NEG  Strep Pneumo 5/22 >> neg     A: HCAP vs CAP --> initially on broad coverage due to unclear medical background Narrowed off Vanc and Cefepime 5/23 5/29 >CXR improved  P:   Urine Legionella Ag - neg Vanco, start date 5/21>>>5/23 Cefepime, start date 5/21>>>5/23  Azithro, start date 5/20>>> 5/25 Ceftriaxone, start date 5/23>> stop 29th  ENDOCRINE A: DM Improved CBGs with resistant SSI and addition of Lantus P:   Resistant SSI Continue Lantus 5u   NEUROLOGIC A: ICU Associated Discomfort Delirium  Minimal sedation, opens eyes on WUA but not following commands  P:  Monitor neuro status  WUA Fentanyl gtt for pain  CT head     FAMILY: Family updated by Dr. Tyson Alias 5/23, 24th, and by resident 5/25, 5/28 wife updated at bedside   Tammy Parrett NP-C  [facility]  [phone]     Attending:  I have seen and examined the patient with nurse practitioner/resident and agree with the note above.   Passing SBT but slow to wake up this morning, lungs clear, CXR looks good TSH normal earlier this admission Plan to check head CT again today, check ammonia, B12 Rest as above  My cc time 35 minutes  [place], MD [place] [facility] Pager: [phone] Cell: [phone] After 3pm or if no response, call [phone]

## 2014-09-24 NOTE — Progress Notes (Signed)
[facility] ROUNDING NOTE   Subjective:   Interval History: slow to wake and neurological status is indeterminate at this time  Objective:  Vital signs in last 24 hours:  Temp:  [98.6 F (37 C)-99.4 F (37.4 C)] 99.1 F (37.3 C) (05/29 0739) Pulse Rate:  [74-94] 81 (05/29 1141) Resp:  [11-22] 16 (05/29 1141) BP: (93-167)/(47-127) 120/71 mmHg (05/29 1141) SpO2:  [100 %] 100 % (05/29 1141) FiO2 (%):  [40 %] 40 % (05/29 1141) Weight:  [92.4 kg (203 lb 11.3 oz)] 92.4 kg (203 lb 11.3 oz) (05/29 0400)  Weight change: -1.1 kg (-2 lb 6.8 oz) Filed Weights   09/22/14 0420 09/23/14 0400 09/24/14 0400  Weight: 97.2 kg (214 lb 4.6 oz) 93.5 kg (206 lb 2.1 oz) 92.4 kg (203 lb 11.3 oz)    Intake/Output: I/O last 3 completed shifts: In: 1071.1 [I.V.:171.1; NG/GT:900] Out: 4149 [Urine:75; Other:4074]   Intake/Output this shift:  Total I/O In: 20 [NG/GT:20] Out: 798 [Other:798]  CVS- RRR RS- CTA ABD- BS present soft non-distended EXT- 2 + edema   Basic Metabolic Panel:  Recent Labs Lab 09/22/14 0427 09/22/14 1630 09/23/14 0430 09/23/14 1640 09/24/14 0515 09/24/14 0524  NA 142 148* 141 140 137  --   K 4.7 5.3* 5.4* 4.8 4.7  --   CL 112* 116* 107 103 101  --   CO2 21* --   GLUCOSE 261* 214* 185* 156* 119*  --   BUN 154* 158* 109* 81* 67*  --   CREATININE 6.73* 6.79* 4.73* 3.61* 3.12*  --   CALCIUM 8.7* 8.8* 8.3* 8.6* 8.6*  --   MG  --   --   --   --   --  2.8*  PHOS  --   --   --   --   --  4.8*    Liver Function Tests:  Recent Labs Lab 09/18/14 0330  AST 17  ALT 16*  ALKPHOS 53  BILITOT 0.7  PROT 6.3*  ALBUMIN 2.3*   No results for input(s): LIPASE, AMYLASE in the last 168 hours. No results for input(s): AMMONIA in the last 168 hours.  CBC:  Recent Labs Lab 09/21/14 0359 09/21/14 1700 09/22/14 0427 09/23/14 0430 09/24/14 0515  WBC 7.0 8.6 9.2 11.6* 12.9*  HGB 7.4* 7.5* 7.2* 7.2* 7.4*  HCT 23.7* 24.0* 23.2* 23.5* 24.3*   MCV 86.5 86.3 86.2 89.0 88.4  PLT 211 236 240 233 280    Cardiac Enzymes:  Recent Labs Lab 09/17/14 1453 09/18/14 0330  TROPONINI 0.10* 0.08*    BNP: Invalid input(s): POCBNP  CBG:  Recent Labs Lab 09/23/14 1545 09/23/14 2036 09/23/14 2339 09/24/14 0327 09/24/14 0738  GLUCAP 152* 192* 162* 126* 138*    Microbiology: Results for orders placed or performed during the hospital encounter of 2014/09/26  MRSA PCR Screening     Status: None   Collection Time: 09-26-14  4:00 PM  Result Value Ref Range Status   MRSA by PCR NEGATIVE NEGATIVE Final    Comment:        The GeneXpert MRSA Assay (FDA approved for NASAL specimens only), is one component of a comprehensive MRSA colonization surveillance program. It is not intended to diagnose MRSA infection nor to guide or monitor treatment for MRSA infections.   Culture, blood (routine x 2) Call MD if unable to obtain prior to antibiotics being given     Status: None   Collection Time: 09-26-2014  6:15  PM  Result Value Ref Range Status   Specimen Description BLOOD RIGHT HAND  Final   Special Requests BOTTLES DRAWN AEROBIC AND ANAEROBIC 6CC  Final   Culture NO GROWTH 5 DAYS  Final   Report Status 09/20/2014 FINAL  Final  Culture, blood (routine x 2) Call MD if unable to obtain prior to antibiotics being given     Status: None   Collection Time: 09/07/2014  6:20 PM  Result Value Ref Range Status   Specimen Description BLOOD RIGHT ARM  Final   Special Requests BOTTLES DRAWN AEROBIC AND ANAEROBIC 6CC  Final   Culture NO GROWTH 5 DAYS  Final   Report Status 09/20/2014 FINAL  Final    Coagulation Studies:  Recent Labs  09/22/14 1300  LABPROT 16.3*  INR 1.29    Urinalysis: No results for input(s): COLORURINE, LABSPEC, PHURINE, GLUCOSEU, HGBUR, BILIRUBINUR, KETONESUR, PROTEINUR, UROBILINOGEN, NITRITE, LEUKOCYTESUR in the last 72 hours.  Invalid input(s): APPERANCEUR    Imaging: Dg Chest Port 1 View  09/24/2014    CLINICAL DATA:  Pneumonia  EXAM: PORTABLE CHEST - 1 VIEW  COMPARISON:  09/23/2014  FINDINGS: Endotracheal tube tip is in good position between the clavicular heads and carina. Bilateral IJ central venous catheters, tips at the SVC level. The orogastric tube is in the stomach.  There is no edema, residual consolidation, effusion, or pneumothorax.  Normal heart size and mediastinal contours.  IMPRESSION: 1. Tubes and lines remain in good position. 2. Significantly improved aeration since admission.   Electronically Signed   By: Marnee Spring M.D.   On: 09/24/2014 06:08   Dg Chest Port 1 View  09/23/2014   CLINICAL DATA:  Pneumonia  EXAM: PORTABLE CHEST - 1 VIEW  COMPARISON:  09/22/2014  FINDINGS: Right IJ hemodialysis catheter terminates of the distal SVC. Left IJ central venous catheter is noted with tip over the distal SVC. No pneumothorax. Lungs are hypoaerated with crowding of the bronchovascular markings. Patchy bibasilar opacities have improved along with mild improvement in overall aeration. No significant pleural effusion. No acute osseous finding.  IMPRESSION: Slight improvement in bibasilar aeration.   Electronically Signed   By: Christiana Pellant M.D.   On: 09/23/2014 07:31   Dg Chest Port 1 View  09/22/2014   CLINICAL DATA:  Hypoxia  EXAM: PORTABLE CHEST - 1 VIEW  COMPARISON:  Study obtained earlier in the day  FINDINGS: Endotracheal tube tip is 6.2 cm above the carina. Each central catheter tip is in the superior vena cava. Nasogastric tube tip and side port are in the stomach. No pneumothorax. There is slight bibasilar atelectatic change. Lungs are otherwise clear. Heart is mildly enlarged with pulmonary vascularity within normal limits. No adenopathy. There is arthropathy in each shoulder.  IMPRESSION: Tube and catheter positions as described without pneumothorax. Slight bibasilar atelectatic change. No edema or consolidation. No change in cardiac silhouette.   Electronically Signed   By: Bretta Bang III M.D.   On: 09/22/2014 15:28   Dg Chest Port 1 View  09/22/2014   CLINICAL DATA:  Status post dialysis catheter insertion  EXAM: PORTABLE CHEST - 1 VIEW  COMPARISON:  09/21/2014  FINDINGS: ET tube tip is above the carina. There is a left IJ catheter with tip in the projection of the SVC. Right IJ catheter tip is in the cavoatrial junction. No pneumothorax identified. Heart size is normal. Atelectasis is noted in both lung bases.  IMPRESSION: 1. No pneumothorax after placement of right IJ catheter. The  tip is in the cavoatrial junction. 2. Bibasilar atelectasis noted.   Electronically Signed   By: Signa Kell M.D.   On: 09/22/2014 15:21   Dg Abd Portable 1v  09/22/2014   CLINICAL DATA:  Vascular dialysis catheter in place.  EXAM: PORTABLE ABDOMEN - 1 VIEW  COMPARISON:  09/17/2014  FINDINGS: Nasogastric tube has been pulled back since prior study now projecting in the mid stomach, well positioned.  Normal bowel gas pattern.  No evidence of bowel obstruction.  No evidence of renal or ureteral stones. Soft tissues are unremarkable.  Scattered areas of bony sclerosis, stable from prior exams. This could reflect blastic metastatic disease. It may be related to renal osteodystrophy.  IMPRESSION: 1. No acute finding.   Electronically Signed   By: Amie Portland M.D.   On: 09/22/2014 14:40     Medications:   . fentaNYL infusion INTRAVENOUS Stopped (09/24/14 0000)  . dialysis replacement fluid (prismasate) 200 mL/hr at 09/23/14 1734  . dialysis replacement fluid (prismasate) 300 mL/hr at 09/24/14 1209  . dialysate (PRISMASATE) 2,000 mL/hr at 09/24/14 1028   . antiseptic oral rinse  7 mL Mouth Rinse QID  . aspirin  81 mg Per Tube Daily  . calcitRIOL  0.25 mcg Oral Q M,W,F-1800  . chlorhexidine  15 mL Mouth Rinse BID  . feeding supplement (OXEPA)  1,000 mL Per Tube Q24H  . feeding supplement (PRO-STAT SUGAR FREE 64)  60 mL Per Tube TID  . heparin  5,000 Units Subcutaneous 3 times per day  .  hydrALAZINE  25 mg Oral 3 times per day  . insulin aspart  0-20 Units Subcutaneous 6 times per day  . insulin glargine  5 Units Subcutaneous QHS  . labetalol  200 mg Oral BID  . pantoprazole sodium  40 mg Per Tube Q1200  . 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

## 2014-09-25 ENCOUNTER — Inpatient Hospital Stay (HOSPITAL_COMMUNITY): Payer: Commercial Managed Care - HMO

## 2014-09-25 LAB — CBC
HEMATOCRIT: 24.9 % — AB (ref 39.0–52.0)
HEMOGLOBIN: 7.6 g/dL — AB (ref 13.0–17.0)
MCH: 26.7 pg (ref 26.0–34.0)
MCHC: 30.5 g/dL (ref 30.0–36.0)
MCV: 87.4 fL (ref 78.0–100.0)
Platelets: 346 10*3/uL (ref 150–400)
RBC: 2.85 MIL/uL — ABNORMAL LOW (ref 4.22–5.81)
RDW: 14.6 % (ref 11.5–15.5)
WBC: 16.6 10*3/uL — ABNORMAL HIGH (ref 4.0–10.5)

## 2014-09-25 LAB — RENAL FUNCTION PANEL
ANION GAP: 13 (ref 5–15)
Albumin: 2.8 g/dL — ABNORMAL LOW (ref 3.5–5.0)
BUN: 58 mg/dL — ABNORMAL HIGH (ref 6–20)
CALCIUM: 9.1 mg/dL (ref 8.9–10.3)
CO2: 23 mmol/L (ref 22–32)
Chloride: 98 mmol/L — ABNORMAL LOW (ref 101–111)
Creatinine, Ser: 2.82 mg/dL — ABNORMAL HIGH (ref 0.61–1.24)
GFR calc non Af Amer: 22 mL/min — ABNORMAL LOW (ref >60)
GFR, EST AFRICAN AMERICAN: 25 mL/min — AB (ref >60)
Glucose, Bld: 283 mg/dL — ABNORMAL HIGH (ref 65–99)
PHOSPHORUS: 5.3 mg/dL — AB (ref 2.5–4.6)
Potassium: 4.5 mmol/L (ref 3.5–5.1)
Sodium: 134 mmol/L — ABNORMAL LOW (ref 135–145)

## 2014-09-25 LAB — BASIC METABOLIC PANEL
ANION GAP: 11 (ref 5–15)
BUN: 59 mg/dL — ABNORMAL HIGH (ref 6–20)
CO2: 26 mmol/L (ref 22–32)
CREATININE: 2.98 mg/dL — AB (ref 0.61–1.24)
Calcium: 9 mg/dL (ref 8.9–10.3)
Chloride: 100 mmol/L — ABNORMAL LOW (ref 101–111)
GFR calc Af Amer: 24 mL/min — ABNORMAL LOW (ref >60)
GFR calc non Af Amer: 20 mL/min — ABNORMAL LOW (ref >60)
Glucose, Bld: 153 mg/dL — ABNORMAL HIGH (ref 65–99)
POTASSIUM: 4.7 mmol/L (ref 3.5–5.1)
Sodium: 137 mmol/L (ref 135–145)

## 2014-09-25 LAB — GLUCOSE, CAPILLARY
GLUCOSE-CAPILLARY: 232 mg/dL — AB (ref 65–99)
Glucose-Capillary: 209 mg/dL — ABNORMAL HIGH (ref 65–99)
Glucose-Capillary: 120 mg/dL — ABNORMAL HIGH (ref 65–99)
Glucose-Capillary: 201 mg/dL — ABNORMAL HIGH (ref 65–99)
Glucose-Capillary: 188 mg/dL — ABNORMAL HIGH (ref 65–99)
Glucose-Capillary: 182 mg/dL — ABNORMAL HIGH (ref 65–99)
Glucose-Capillary: 232 mg/dL — ABNORMAL HIGH (ref 65–99)

## 2014-09-25 MED ORDER — FENTANYL CITRATE (PF) 100 MCG/2ML IJ SOLN: 50.0000 ug | INTRAMUSCULAR | Status: DC | PRN

## 2014-09-25 MED ORDER — FENTANYL CITRATE (PF) 100 MCG/2ML IJ SOLN: 25.0000 ug | INTRAMUSCULAR | Status: DC | PRN

## 2014-09-25 MED ORDER — DARBEPOETIN ALFA 100 MCG/0.5ML IJ SOSY
100.0000 ug | PREFILLED_SYRINGE | INTRAMUSCULAR | Status: DC
  Administered 2014-09-25: 100 ug via SUBCUTANEOUS
  Filled 2014-09-25: qty 0.5

## 2014-09-25 MED ORDER — SODIUM CHLORIDE 0.9 % IV SOLN
510.0000 mg | INTRAVENOUS | Status: DC
  Administered 2014-09-25: 510 mg via INTRAVENOUS
  Filled 2014-09-25: qty 17

## 2014-09-25 NOTE — Progress Notes (Signed)
Patient ID: Roger Ashley, male   DOB: [birth year], 69 y.o.   MRN: [id number]  [facility] Progress Note    Assessment/ Plan:   1. AKI on CKD stage IV/V: anuric and dependent on CRRT at this time. Will continue this for another 24 hours and then plan transition to IHD as it appears that he could hemodynamically tolerate this. No change to CRRT prescription at this time. 2. Acute hypoxic respiratory failure: ARDS v/s CHF: weaning from ventilator as tolerated, sepsis markers resolved. On coverage for CAP 3. Anemia: Hgb low, low iron levels- start ESA and give IV iron 4. 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 Depressed mentation: sedation lightened and CT head reviewed (chronic changes noted but no acute events)  Subjective:   No acute events overnight--poorly responsive with no acute changes noted on CT head yesterday   Objective:   BP 128/98 mmHg  Pulse 85  Temp(Src) 98.1 F (36.7 C) (Oral)  Resp 16  Ht  (1.676 m)  Wt 88.5 kg (195 lb 1.7 oz)  BMI 31.51 kg/m2  SpO2 100%  Intake/Output Summary (Last 24 hours) at 09/25/14 0741 Last data filed at 09/25/14 0700  Gross per 24 hour  Intake    680 ml  Output   3206 ml  Net  -2526 ml   Weight change: -3.9 kg (-8 lb 9.6 oz)  Physical Exam: Gen: Intubated, staring into space but not responsive WUJ:WJXBJ RRR, normal s1/s2  Resp:CTA bilaterally, no rales/rhonchi YNW:GNFA, obese, NT, BS normal Ext:No LE edema, LUA AVF with good thrill and bruit  Imaging: Ct Head Wo Contrast  09/24/2014   CLINICAL DATA:  Delirium, on vent, combative  EXAM: CT HEAD WITHOUT CONTRAST  TECHNIQUE: Contiguous axial images were obtained from the base of the skull through the vertex without intravenous contrast.  COMPARISON:  09/19/2014  FINDINGS: Motion degraded images.  No evidence of parenchymal hemorrhage or extra-axial fluid collection. No mass lesion, mass effect, or midline shift.  No CT evidence of acute infarction.   Subcortical Broz matter and periventricular small vessel ischemic changes. Intracranial atherosclerosis.  Global cortical atrophy.  Secondary ventriculomegaly.  The visualized paranasal sinuses are essentially clear. Partial opacification of the right mastoid air cells.  No evidence of calvarial fracture.  IMPRESSION: Motion degraded images.  No evidence of acute intracranial abnormality.  Atrophy with small vessel ischemic changes.   Electronically Signed   By: Charline Bills M.D.   On: 09/24/2014 14:35   Dg Chest Port 1 View  09/25/2014   CLINICAL DATA:  Delirium  EXAM: PORTABLE CHEST - 1 VIEW  COMPARISON:  09/24/2014  FINDINGS: There is a right IJ catheter. The tip is in the SVC. The ET tube tip is above the carina. Left IJ catheter tip is in the projection of the SVC. NG tube tip is in the stomach. Normal heart size. No pleural effusion or edema.  IMPRESSION: 1. Stable position of support apparatus. 2. Clear lungs.   Electronically Signed   By: Signa Kell M.D.   On: 09/25/2014 07:29   Dg Chest Port 1 View  09/24/2014   CLINICAL DATA:  Pneumonia  EXAM: PORTABLE CHEST - 1 VIEW  COMPARISON:  09/23/2014  FINDINGS: Endotracheal tube tip is in good position between the clavicular heads and carina. Bilateral IJ central venous catheters, tips at the SVC level. The orogastric tube is in the stomach.  There is no edema, residual consolidation, effusion, or pneumothorax.  Normal heart size and mediastinal contours.  IMPRESSION: 1. Tubes and lines remain in good position. 2. Significantly improved aeration since admission.   Electronically Signed   By: Marnee Spring M.D.   On: 09/24/2014 06:08    Labs: BMET  Recent Labs Lab 09/22/14 0427 09/22/14 1630 09/23/14 0430 09/23/14 1640 09/24/14 0515 09/24/14 0524 09/24/14 1721 09/25/14 0430  NA 142 148* 141 140 137  --  136 137  K 4.7 5.3* 5.4* 4.8 4.7  --  4.6 4.7  CL 112* 116* 107 103 101  --  101 100*  CO2 21* 23 25 27 28   --  25 26  GLUCOSE  261* 214* 185* 156* 119*  --  181* 153*  BUN 154* 158* 109* 81* 67*  --  67* 59*  CREATININE 6.73* 6.79* 4.73* 3.61* 3.12*  --  3.42* 2.98*  CALCIUM 8.7* 8.8* 8.3* 8.6* 8.6*  --  8.8* 9.0  PHOS  --   --   --   --   --  4.8* 5.3*  --    CBC  Recent Labs Lab 09/22/14 0427 09/23/14 0430 09/24/14 0515 09/25/14 0430  WBC 9.2 11.6* 12.9* 16.6*  HGB 7.2* 7.2* 7.4* 7.6*  HCT 23.2* 23.5* 24.3* 24.9*  MCV 86.2 89.0 88.4 87.4  PLT 240 233 280 346    Medications:    . antiseptic oral rinse  7 mL Mouth Rinse QID  . aspirin  81 mg Per Tube Daily  . calcitRIOL  0.25 mcg Oral Q M,W,F-1800  . chlorhexidine  15 mL Mouth Rinse BID  . feeding supplement (OXEPA)  1,000 mL Per Tube Q24H  . feeding supplement (PRO-STAT SUGAR FREE 64)  60 mL Per Tube TID  . heparin  5,000 Units Subcutaneous 3 times per day  . hydrALAZINE  25 mg Oral 3 times per day  . insulin aspart  0-20 Units Subcutaneous 6 times per day  . insulin glargine  5 Units Subcutaneous QHS  . labetalol  200 mg Oral BID  . pantoprazole sodium  40 mg Per Tube Q1200  . cyanocobalamin  100 mcg Oral Daily   Zetta Bills, MD 09/25/2014, 7:41 AM

## 2014-09-25 NOTE — Progress Notes (Signed)
PULMONARY / CRITICAL CARE MEDICINE HISTORY AND PHYSICAL EXAMINATION   Name: Roger Ashley MRN: [id number] DOB: Feb 23, 1946    ADMISSION DATE:  09/10/2014  PRIMARY SERVICE: PCCM  CHIEF COMPLAINT:  SOB  BRIEF PATIENT DESCRIPTION: 71 M with EF 40-45%, CKD 4, DM, HTN who presented to [facility] with worsening SOB 2/2 ARDS vs aCHF and eventually required intubation. Transferred to [facility] for further management.   SIGNIFICANT EVENTS / STUDIES:  5/20: Admit with respiratory distress, ABG on admission 7.336/36.2/69.2 (on BiPAP).  Intubated, ARDS protocol  5/22: Not synchronous on vent, CXR improved.  ARDS protocol d/c'd, f/u ABG >> 7.15/ 63/79/22.  Nimbex gtt started. Poorly controlled CBGs --> transitioned to resistant SSI 5/23: Off Nimbex and Levo during the day, Levo restarted overnight briefly. Pt able to open eyes to name. 5/25: Still with volume on board. Lasix given. Off paralytic, agitated off sedation.  5/26: Worsening renal fx but with good uop.  5/27 >renal consult >CRRT started  5/29 >CT head >no acute process   SUBJECTIVE: Neg I/O Bal with CRRT ,scr tr down, oliguric to anuric  Weaned yesterday PS 5/5 for ~6 hr Off all sedation for 24hr  More responsive -turned head to name and wiggled toes to command >CT head yesterday neg for acute     VITAL SIGNS: Temp:  [98.1 F (36.7 C)-99.1 F (37.3 C)] 99 F (37.2 C) (05/30 0741) Pulse Rate:  [78-98] 85 (05/30 0700) Resp:  [14-24] 16 (05/30 0700) BP: (90-167)/(43-133) 128/98 mmHg (05/30 0600) SpO2:  [100 %] 100 % (05/30 0700) FiO2 (%):  [40 %] 40 % (05/30 0600) Weight:  [195 lb 1.7 oz (88.5 kg)] 195 lb 1.7 oz (88.5 kg) (05/30 0400)   HEMODYNAMICS:     VENTILATOR SETTINGS: Vent Mode:  [-] PRVC FiO2 (%):  [40 %] 40 % Set Rate:  [15 bmp] 15 bmp Vt Set:  [510 mL] 510 mL PEEP:  [5 cmH20] 5 cmH20 Pressure Support:  [5 cmH20] 5 cmH20 Plateau Pressure:  [16 cmH20] 16 cmH20   INTAKE / OUTPUT: Intake/Output      05/29 0701 - 05/30 0700  05/30 0701 - 05/31 0700   I.V. (mL/kg)     NG/GT 680    Total Intake(mL/kg) 680 (7.7)    Urine (mL/kg/hr) 0 (0)    Other 3206 (1.5)    Stool 0 (0)    Total Output 3206     Net -2526          Stool Occurrence 1 x      PHYSICAL EXAMINATION: General: NAD, Intubated HEENT: OETT in place, jvd down Neck: Trachea supple and midline Cardiovascular:  RRR  Lungs: Decreased BS in bases  Abdomen: S, NT, ND Musculoskeletal: No acute deformities  Skin: Warm/dry, no appreciable edema. LUE AVF, bruit and thrill present.  Neuro: no sedation, not following commands , maew x 4   LABS:  CBC  Recent Labs Lab 09/23/14 0430 09/24/14 0515 09/25/14 0430  WBC 11.6* 12.9* 16.6*  HGB 7.2* 7.4* 7.6*  HCT 23.5* 24.3* 24.9*  PLT 233 280 346   Coag's  Recent Labs Lab 09/22/14 1300  INR 1.29     BMET  Recent Labs Lab 09/24/14 0515 09/24/14 1721 09/25/14 0430  NA 137 136 137  K 4.7 4.6 4.7  CL 101 101 100*  CO2 BUN 67* 67* 59*  CREATININE 3.12* 3.42* 2.98*  GLUCOSE 119* 181* 153*   Electrolytes  Recent Labs Lab 09/24/14 0515 09/24/14 0524 09/24/14  1721 09/25/14 0430  CALCIUM 8.6*  --  8.8* 9.0  MG  --  2.8*  --   --   PHOS  --  4.8* 5.3*  --    Sepsis Markers  Recent Labs Lab 09/19/14 0353  PROCALCITON 45.06   ABG No results for input(s): PHART, PCO2ART, PO2ART in the last 168 hours. Liver Enzymes  Recent Labs Lab 09/24/14 1721  ALBUMIN 2.5*     Cardiac Enzymes No results for input(s): TROPONINI, PROBNP in the last 168 hours. Glucose  Recent Labs Lab 09/24/14 0738 09/24/14 1140 09/24/14 1600 09/24/14 2035 09/24/14 2321 09/25/14 0323  GLUCAP 138* 214* 143* 158* 209* 120*    Imaging Ct Head Wo Contrast  09/24/2014   CLINICAL DATA:  Delirium, on vent, combative  EXAM: CT HEAD WITHOUT CONTRAST  TECHNIQUE: Contiguous axial images were obtained from the base of the skull through the vertex without intravenous contrast.  COMPARISON:   09/19/2014  FINDINGS: Motion degraded images.  No evidence of parenchymal hemorrhage or extra-axial fluid collection. No mass lesion, mass effect, or midline shift.  No CT evidence of acute infarction.  Subcortical Egger matter and periventricular small vessel ischemic changes. Intracranial atherosclerosis.  Global cortical atrophy.  Secondary ventriculomegaly.  The visualized paranasal sinuses are essentially clear. Partial opacification of the right mastoid air cells.  No evidence of calvarial fracture.  IMPRESSION: Motion degraded images.  No evidence of acute intracranial abnormality.  Atrophy with small vessel ischemic changes.   Electronically Signed   By: Charline Bills M.D.   On: 09/24/2014 14:35   Dg Chest Port 1 View  09/25/2014   CLINICAL DATA:  Delirium  EXAM: PORTABLE CHEST - 1 VIEW  COMPARISON:  09/24/2014  FINDINGS: There is a right IJ catheter. The tip is in the SVC. The ET tube tip is above the carina. Left IJ catheter tip is in the projection of the SVC. NG tube tip is in the stomach. Normal heart size. No pleural effusion or edema.  IMPRESSION: 1. Stable position of support apparatus. 2. Clear lungs.   Electronically Signed   By: Signa Kell M.D.   On: 09/25/2014 07:29   Dg Chest Port 1 View  09/24/2014   CLINICAL DATA:  Pneumonia  EXAM: PORTABLE CHEST - 1 VIEW  COMPARISON:  09/23/2014  FINDINGS: Endotracheal tube tip is in good position between the clavicular heads and carina. Bilateral IJ central venous catheters, tips at the SVC level. The orogastric tube is in the stomach.  There is no edema, residual consolidation, effusion, or pneumothorax.  Normal heart size and mediastinal contours.  IMPRESSION: 1. Tubes and lines remain in good position. 2. Significantly improved aeration since admission.   Electronically Signed   By: Marnee Spring M.D.   On: 09/24/2014 06:08    ASSESSMENT / PLAN:  PULMONARY A: Acute hypoxemic respiratory failure - DDx includes ARDS vs acute heart  failure.  BNP 547 on admit.  Diffuse Bilateral Airspace Disease >lungs clear on CXR 5/30 ARDS P:   Wean as tolerated  Will need improved neurostatus for extubation success pcxr in am       CARDIOVASCULAR L IJ TLC 5/20 >>  A: Chronic Systolic HF- Per chart review most care at [facility].  H/o HTN Off pressors since 5/25 P:   Labetalol BID Hydralazine Three times a day   ICU monitoring  ASA   RENAL A: AKI on CKD - Likely 2/2 aggressive diuresis and ATN>>CRRT started 5/27   Has inmature fistula  5/30 >scr tr down , near anuric  , neg bal with CRRT  P:   CRRT per  Renal  Monitor electrolytes   GASTROINTESTINAL A: Vent Associated Dysphagia 5/30 >TF residuals improved and BM improved , off miralax 5/28    P:  Continue TF: Oxepa @20   PPI  Bowel regimen: Colace As needed    HEMATOLOGIC A: Anemia - Likely AOCD Hgb stable 7.6 , no active bleeding  [employer]ehovah witness and will not take blood   P:   CBC in AM DVT PPx: [place] Heparin  INFECTIOUS Sputum Culture 5/21 >>not done  Blood Culture x 2 5/20 >>NEG  Strep Pneumo 5/22 >> neg     A: HCAP vs CAP --> initially on broad coverage due to unclear medical background Narrowed off Vanc and Cefepime 5/23 5/30 >CXR lungs clear  P:   Urine Legionella Ag - neg Vanco, start date 5/21>>>5/23 Cefepime, start date 5/21>>>5/23  Azithro, start date 5/20>>> 5/25 Ceftriaxone, start date 5/23>> 5/29  >remain off abx, trend wbc/temp curve  ENDOCRINE A: DM Improved CBGs with resistant SSI and addition of Lantus P:   Resistant SSI Continue Lantus 5u   NEUROLOGIC A: ICU Associated Discomfort Delirium   5/30 repeat CT head 5/29 neg for acute process , ammonia nml , B12 high  Off sedation x 24hr , mild response on exam-improved  P:  Monitor neuro status  WUA Avoid oversedation  Fentanyl  Pain  As needed        FAMILY: updated 5/30 bedside by Pixie Casino Parrett NP-C  [facility]  [phone]      Attending:  I have seen and examined the patient with nurse practitioner/resident and agree with the note above.   CT head negative CXR personally reviewed> improving aeration On exam following commands, passing SBT, still sleepy  Good trend in terms of mental status, likely extubation tomorrow Hold fentanyl as long as able, only prn doses, use sparingly Rest as above  My cc time 40 minutes  Heber McAllen, MD [facility] Pager: [phone] Cell: [phone] After 3pm or if no response, call [phone]

## 2014-09-25 NOTE — Progress Notes (Signed)
eLink Physician-Brief Progress Note Patient Name: Temarion Hanlin DOB: 24-Jul-1945 MRN: [id number]   Date of Service  09/25/2014  HPI/Events of Note  4 loose BM's. Nurse requests Flexiseal.   eICU Interventions  Will order Flexiseal.      Intervention Category Minor Interventions: Routine modifications to care plan (e.g. PRN medications for pain, fever)  Sommer,Steven Eugene 09/25/2014, 10:04 PM

## 2014-09-26 ENCOUNTER — Inpatient Hospital Stay (HOSPITAL_COMMUNITY): Payer: Commercial Managed Care - HMO

## 2014-09-26 DIAGNOSIS — I63339 Cerebral infarction due to thrombosis of unspecified posterior cerebral artery: Secondary | ICD-10-CM

## 2014-09-26 DIAGNOSIS — G9340 Encephalopathy, unspecified: Secondary | ICD-10-CM | POA: Insufficient documentation

## 2014-09-26 DIAGNOSIS — R410 Disorientation, unspecified: Secondary | ICD-10-CM

## 2014-09-26 LAB — CBC
HCT: 26.8 % — ABNORMAL LOW (ref 39.0–52.0)
Hemoglobin: 8.3 g/dL — ABNORMAL LOW (ref 13.0–17.0)
MCH: 27.5 pg (ref 26.0–34.0)
MCHC: 31 g/dL (ref 30.0–36.0)
MCV: 88.7 fL (ref 78.0–100.0)
PLATELETS: 367 10*3/uL (ref 150–400)
RBC: 3.02 MIL/uL — ABNORMAL LOW (ref 4.22–5.81)
RDW: 15.3 % (ref 11.5–15.5)
WBC: 25.7 10*3/uL — ABNORMAL HIGH (ref 4.0–10.5)

## 2014-09-26 LAB — BASIC METABOLIC PANEL
ANION GAP: 19 — AB (ref 5–15)
ANION GAP: 23 — AB (ref 5–15)
BUN: 67 mg/dL — ABNORMAL HIGH (ref 6–20)
BUN: 103 mg/dL — ABNORMAL HIGH (ref 6–20)
CHLORIDE: 97 mmol/L — AB (ref 101–111)
CO2: 21 mmol/L — ABNORMAL LOW (ref 22–32)
CO2: 16 mmol/L — AB (ref 22–32)
Calcium: 9.6 mg/dL (ref 8.9–10.3)
Calcium: 9.4 mg/dL (ref 8.9–10.3)
Chloride: 99 mmol/L — ABNORMAL LOW (ref 101–111)
Creatinine, Ser: 3.48 mg/dL — ABNORMAL HIGH (ref 0.61–1.24)
Creatinine, Ser: 5.49 mg/dL — ABNORMAL HIGH (ref 0.61–1.24)
GFR calc Af Amer: 20 mL/min — ABNORMAL LOW (ref >60)
GFR calc non Af Amer: 17 mL/min — ABNORMAL LOW (ref >60)
GFR calc non Af Amer: 10 mL/min — ABNORMAL LOW (ref >60)
GFR, EST AFRICAN AMERICAN: 11 mL/min — AB (ref >60)
Glucose, Bld: 208 mg/dL — ABNORMAL HIGH (ref 65–99)
Glucose, Bld: 282 mg/dL — ABNORMAL HIGH (ref 65–99)
POTASSIUM: 5.2 mmol/L — AB (ref 3.5–5.1)
Potassium: 4.7 mmol/L (ref 3.5–5.1)
Sodium: 137 mmol/L (ref 135–145)
Sodium: 138 mmol/L (ref 135–145)

## 2014-09-26 LAB — GLUCOSE, CAPILLARY
GLUCOSE-CAPILLARY: 291 mg/dL — AB (ref 65–99)
GLUCOSE-CAPILLARY: 286 mg/dL — AB (ref 65–99)
GLUCOSE-CAPILLARY: 251 mg/dL — AB (ref 65–99)
Glucose-Capillary: 181 mg/dL — ABNORMAL HIGH (ref 65–99)
Glucose-Capillary: 274 mg/dL — ABNORMAL HIGH (ref 65–99)

## 2014-09-26 LAB — CLOSTRIDIUM DIFFICILE BY PCR: CDIFFPCR: NEGATIVE

## 2014-09-26 MED ORDER — PRISMASOL BGK 4/2.5 32-4-2.5 MEQ/L IV SOLN
INTRAVENOUS | Status: DC
  Administered 2014-09-26 – 2014-09-27 (×2): via INTRAVENOUS_CENTRAL
  Filled 2014-09-26 (×3): qty 5000

## 2014-09-26 MED ORDER — PRISMASOL BGK 4/2.5 32-4-2.5 MEQ/L IV SOLN
INTRAVENOUS | Status: DC
  Administered 2014-09-26: 17:00:00 via INTRAVENOUS_CENTRAL
  Filled 2014-09-26: qty 5000

## 2014-09-26 MED ORDER — HEPARIN SODIUM (PORCINE) 1000 UNIT/ML DIALYSIS
1000.0000 [IU] | INTRAMUSCULAR | Status: DC | PRN
  Filled 2014-09-26: qty 2
  Filled 2014-09-26: qty 6

## 2014-09-26 MED ORDER — SODIUM CHLORIDE 0.9 % IV BOLUS (SEPSIS)
500.0000 mL | Freq: Once | INTRAVENOUS | Status: AC
  Administered 2014-09-26: 500 mL via INTRAVENOUS

## 2014-09-26 MED ORDER — HEPARIN (PORCINE) 2000 UNITS/L FOR CRRT
INTRAVENOUS_CENTRAL | Status: DC | PRN
  Filled 2014-09-26: qty 1000

## 2014-09-26 MED ORDER — PRISMASOL BGK 4/2.5 32-4-2.5 MEQ/L IV SOLN
INTRAVENOUS | Status: DC
  Administered 2014-09-26 – 2014-09-27 (×5): via INTRAVENOUS_CENTRAL
  Filled 2014-09-26 (×10): qty 5000

## 2014-09-26 NOTE — Progress Notes (Signed)
eLink Physician-Brief Progress Note Patient Name: Roger Ashley DOB: 08/04/1945 MRN: [id number]   Date of Service  09/26/2014  HPI/Events of Note  BP LOW.  WAS GETTING VOLUME REMOVED WITH [facility].   eICU Interventions  GIVE 500 ML NS FLUID BOLUS.     Intervention Category Major Interventions: Other:  Gretchen Weinfeld 09/26/2014, 4:27 AM

## 2014-09-26 NOTE — Progress Notes (Signed)
Patient ID: Yehudah Grounds, male   DOB: 05-Aug-1945, 69 y.o.   MRN: [id number]  [facility] Progress Note   Assessment/ Plan:   1. 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 No dialysis today indicated- will plan for conventional IHD as long as BP tolerable. 2. Acute hypoxic respiratory failure: ARDS v/s CHF: weaning from ventilator as tolerated, sepsis markers resolved. On coverage for CAP. Hypotension this AM likely from UF with CRRT 3. Anemia: Hgb low, low iron levels- started ESA and giving IV iron 4. 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 Depressed mentation: sedation lightened and CT head reviewed (chronic changes noted but no acute events)  Subjective:   Some response to commands seen overnight. CRRT filter clotted off at 1am this morning and per my instructions- CRRT stopped. Hypotensive overnight- given saline bolus.    Objective:   BP 95/46 mmHg  Pulse 95  Temp(Src) 97.8 F (36.6 C) (Oral)  Resp 22  Ht 5\' 6"  (1.676 m)  Wt 83.6 kg (184 lb 4.9 oz)  BMI 29.76 kg/m2  SpO2 99%  Physical Exam: Gen: intubated, unresponsive MZT:AEWYB RRR, normal s1 and s2 Resp:coarse BS bilaterally, no rales/rhonchi RKV:TXLE, obese, NT, BS normal Ext:No LE edema, LUA AVF with good thrill  Labs: BMET  Recent Labs Lab 09/23/14 0430 09/23/14 1640 09/24/14 0515 09/24/14 0524 09/24/14 1721 09/25/14 0430 09/25/14 1925 09/26/14 0315  NA 141 140 137  --  136 137 134* 137  K 5.4* 4.8 4.7  --  4.6 4.7 4.5 4.7  CL 107 103 101  --  101 100* 98* 97*  CO2 25 27 28   --  25 26 23  21*  GLUCOSE 185* 156* 119*  --  181* 153* 283* 208*  BUN 109* 81* 67*  --  67* 59* 58* 67*  CREATININE 4.73* 3.61* 3.12*  --  3.42* 2.98* 2.82* 3.48*  CALCIUM 8.3* 8.6* 8.6*  --  8.8* 9.0 9.1 9.6  PHOS  --   --   --  4.8* 5.3*  --  5.3*  --    CBC  Recent Labs Lab 09/23/14 0430  09/24/14 0515 09/25/14 0430 09/26/14 0315  WBC 11.6* 12.9* 16.6* 25.7*  HGB 7.2* 7.4* 7.6* 8.3*  HCT 23.5* 24.3* 24.9* 26.8*  MCV 89.0 88.4 87.4 88.7  PLT 233 280 346 367   Medications:    . antiseptic oral rinse  7 mL Mouth Rinse QID  . aspirin  81 mg Per Tube Daily  . calcitRIOL  0.25 mcg Oral Q M,W,F-1800  . chlorhexidine  15 mL Mouth Rinse BID  . darbepoetin (ARANESP) injection - NON-DIALYSIS  100 mcg Subcutaneous Q Mon-1800  . feeding supplement (OXEPA)  1,000 mL Per Tube Q24H  . feeding supplement (PRO-STAT SUGAR FREE 64)  60 mL Per Tube TID  . ferumoxytol  510 mg Intravenous Weekly  . heparin  5,000 Units Subcutaneous 3 times per day  . hydrALAZINE  25 mg Oral 3 times per day  . insulin aspart  0-20 Units Subcutaneous 6 times per day  . insulin glargine  5 Units Subcutaneous QHS  . labetalol  200 mg Oral BID  . pantoprazole sodium  40 mg Per Tube Q1200  . cyanocobalamin  100 mcg Oral Daily   Zetta Bills, MD 09/26/2014, 7:51 AM

## 2014-09-26 NOTE — Progress Notes (Signed)
Patient had an episode of emesis.  E link called and notified.  Tube feeding stopped and OG connected to suctions.  No new orders received.  Keitha Butte, RN

## 2014-09-26 NOTE — Consult Note (Signed)
Admission H&P    Chief Complaint: Encephalopathic state, unresponsive.  HPI: Roger Ashley is an 69 y.o. male with cardiomyopathy, chronic kidney disease stage IV, diabetes mellitus and hypertension who was transferred from [facility] on 09/24/2014 for management of worsening of shortness of breath with respiratory failure. Patient was transferred to [facility] following intubation. Patient has remained intubated and ventilator dependent. He reportedly was responsive yesterday including to voice, as well as following simple commands. Overnight he has become unresponsive. He has not been febrile. His WBC count today was 25.7, up from 16.6 yesterday. BUN was 67 and creatinine was 3.48, up from 2.82 yesterday. CT scan of his head was obtained today which showed is consistent with acute left occipital ischemic infarction. Time of onset is unclear.  LSN: Unclear; patient was more responsive on 09/25/2014. tPA Given: No: Critically ill and unstable, as well as unclear time of onset of acute stroke area mRankin:  Past Medical History  Diagnosis Date  . Nonischemic cardiomyopathy 2002    LVEF 15-20% initially; EF recovred to 50% by Echo in 03/2002; MUGA at [facility] EF 53%  . Diabetes mellitus type II   . Hyperlipidemia   . Essential hypertension   . 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 Erectile dysfunction   . Hearing impairment   . Anemia   . Acute respiratory failure   . CAP (community acquired pneumonia)   . Sepsis     2016    Past Surgical History  Procedure Laterality Date  . Colonoscopy w/ polypectomy    . Av fistula placement      Family History  Problem Relation Age of Onset  . Diabetes Other    Social History:  reports that he quit smoking about 32 years ago. His smoking use included Cigarettes. He started smoking about 52 years ago. He has a 40 pack-year smoking history. He has never used smokeless tobacco. He reports that he does not drink alcohol or  use illicit drugs.  Allergies:  Allergies  Allergen Reactions  . Clonidine Derivatives Other (See Comments)    Hallucinations; nightmares  . Crestor [Rosuvastatin] Other (See Comments)    myalgias    Medications Prior to Admission  Medication Sig Dispense Refill  . aspirin 81 MG tablet Take 81 mg by mouth daily.      . calcitRIOL (ROCALTROL) 0.25 MCG capsule Take 0.25 mcg by mouth every other day. Monday, Wednesday and Friday    . cyanocobalamin 100 MCG tablet Take 100 mcg by mouth daily.    . furosemide (LASIX) 40 MG tablet Take 40 mg by mouth 2 (two) times daily.      Marland Kitchen glipiZIDE (GLUCOTROL) 5 MG tablet Take 10 mg by mouth 2 (two) times daily before a meal.      . hydrALAZINE (APRESOLINE) 50 MG tablet Take 50 mg by mouth 3 (three) times daily.    . metoprolol (LOPRESSOR) 50 MG tablet Take 100 mg by mouth 2 (two) times daily.      . Multiple Vitamins-Minerals (PRESERVISION AREDS 2 PO) Take by mouth.    Marland Kitchen NIFEdipine (ADALAT CC) 90 MG 24 hr tablet Take 90 mg by mouth daily.      ROS: Unavailable due to patient's mental status.  Physical Examination: Blood pressure 100/45, pulse 94, temperature 98.6 F (37 C), temperature source Oral, resp. rate 30, height $RemoveBe'5\' 6"'tPzanLoZg$  (1.676 m), weight 83.6 kg (184 lb 4.9 oz), SpO2 100 %.  HEENT-  Normocephalic,  no lesions, without obvious abnormality.  Normal external eye and conjunctiva.  Normal TM's bilaterally.  Normal auditory canals and external ears. Normal external nose, mucus membranes and septum.  Normal pharynx. Neck supple with no masses, nodes, nodules or enlargement. Cardiovascular - regular rate and rhythm, S1, S2 normal, no murmur, click, rub or gallop Lungs - air entry reduced lung bases Abdomen - soft, non-tender; bowel sounds normal; no masses,  no organomegaly Extremities - no joint deformities, effusion, or inflammation and no edema  Neurologic Examination: Patient was intubated and on mechanical ventilation. He was unresponsive  including noxious stimuli. Pupils were equal and reacted normally to light. Extraocular movements were sluggish and minimal with oculocephalic maneuvers. No facial weakness was noted. Muscle tone was flaccid throughout. Patient had no abnormal posturing. He had no spontaneous movements no withdrawal movements to noxious stimuli. Deep tendon reflexes were absent throughout. Plantar responses were mute bilaterally.  Results for orders placed or performed during the hospital encounter of 09/23/2014 (from the past 48 hour(s))  Ammonia     Status: None   Collection Time: 09/24/14  2:40 PM  Result Value Ref Range   Ammonia 27 9 - 35 umol/L  Glucose, capillary     Status: Abnormal   Collection Time: 09/24/14  4:00 PM  Result Value Ref Range   Glucose-Capillary 143 (H) 65 - 99 mg/dL  Renal function panel     Status: Abnormal   Collection Time: 09/24/14  5:21 PM  Result Value Ref Range   Sodium 136 135 - 145 mmol/L   Potassium 4.6 3.5 - 5.1 mmol/L   Chloride 101 101 - 111 mmol/L   CO2 25 22 - 32 mmol/L   Glucose, Bld 181 (H) 65 - 99 mg/dL   BUN 67 (H) 6 - 20 mg/dL   Creatinine, Ser 3.42 (H) 0.61 - 1.24 mg/dL   Calcium 8.8 (L) 8.9 - 10.3 mg/dL   Phosphorus 5.3 (H) 2.5 - 4.6 mg/dL   Albumin 2.5 (L) 3.5 - 5.0 g/dL   GFR calc non Af Amer 17 (L) >60 mL/min   GFR calc Af Amer 20 (L) >60 mL/min    Comment: (NOTE) The eGFR has been calculated using the CKD EPI equation. This calculation has not been validated in all clinical situations. eGFR's persistently <60 mL/min signify possible Chronic Kidney Disease.    Anion gap 10 5 - 15  Glucose, capillary     Status: Abnormal   Collection Time: 09/24/14  8:35 PM  Result Value Ref Range   Glucose-Capillary 158 (H) 65 - 99 mg/dL  Glucose, capillary     Status: Abnormal   Collection Time: 09/24/14 11:21 PM  Result Value Ref Range   Glucose-Capillary 209 (H) 65 - 99 mg/dL  Glucose, capillary     Status: Abnormal   Collection Time: 09/25/14  3:23  AM  Result Value Ref Range   Glucose-Capillary 120 (H) 65 - 99 mg/dL  CBC     Status: Abnormal   Collection Time: 09/25/14  4:30 AM  Result Value Ref Range   WBC 16.6 (H) 4.0 - 10.5 K/uL   RBC 2.85 (L) 4.22 - 5.81 MIL/uL   Hemoglobin 7.6 (L) 13.0 - 17.0 g/dL   HCT 24.9 (L) 39.0 - 52.0 %   MCV 87.4 78.0 - 100.0 fL   MCH 26.7 26.0 - 34.0 pg   MCHC 30.5 30.0 - 36.0 g/dL   RDW 14.6 11.5 - 15.5 %   Platelets 346 150 - 400 K/uL  Basic metabolic panel     Status: Abnormal   Collection Time: 09/25/14  4:30 AM  Result Value Ref Range   Sodium 137 135 - 145 mmol/L   Potassium 4.7 3.5 - 5.1 mmol/L   Chloride 100 (L) 101 - 111 mmol/L   CO2 26 22 - 32 mmol/L   Glucose, Bld 153 (H) 65 - 99 mg/dL   BUN 59 (H) 6 - 20 mg/dL   Creatinine, Ser 2.98 (H) 0.61 - 1.24 mg/dL   Calcium 9.0 8.9 - 10.3 mg/dL   GFR calc non Af Amer 20 (L) >60 mL/min   GFR calc Af Amer 24 (L) >60 mL/min    Comment: (NOTE) The eGFR has been calculated using the CKD EPI equation. This calculation has not been validated in all clinical situations. eGFR's persistently <60 mL/min signify possible Chronic Kidney Disease.    Anion gap 11 5 - 15  Glucose, capillary     Status: Abnormal   Collection Time: 09/25/14  7:39 AM  Result Value Ref Range   Glucose-Capillary 201 (H) 65 - 99 mg/dL  Glucose, capillary     Status: Abnormal   Collection Time: 09/25/14 12:10 PM  Result Value Ref Range   Glucose-Capillary 188 (H) 65 - 99 mg/dL  Glucose, capillary     Status: Abnormal   Collection Time: 09/25/14  3:55 PM  Result Value Ref Range   Glucose-Capillary 182 (H) 65 - 99 mg/dL  Renal function panel     Status: Abnormal   Collection Time: 09/25/14  7:25 PM  Result Value Ref Range   Sodium 134 (L) 135 - 145 mmol/L   Potassium 4.5 3.5 - 5.1 mmol/L   Chloride 98 (L) 101 - 111 mmol/L   CO2 23 22 - 32 mmol/L   Glucose, Bld 283 (H) 65 - 99 mg/dL   BUN 58 (H) 6 - 20 mg/dL   Creatinine, Ser 2.82 (H) 0.61 - 1.24 mg/dL   Calcium  9.1 8.9 - 10.3 mg/dL   Phosphorus 5.3 (H) 2.5 - 4.6 mg/dL   Albumin 2.8 (L) 3.5 - 5.0 g/dL   GFR calc non Af Amer 22 (L) >60 mL/min   GFR calc Af Amer 25 (L) >60 mL/min    Comment: (NOTE) The eGFR has been calculated using the CKD EPI equation. This calculation has not been validated in all clinical situations. eGFR's persistently <60 mL/min signify possible Chronic Kidney Disease.    Anion gap 13 5 - 15  Glucose, capillary     Status: Abnormal   Collection Time: 09/25/14  8:38 PM  Result Value Ref Range   Glucose-Capillary 232 (H) 65 - 99 mg/dL  Clostridium Difficile by PCR     Status: None   Collection Time: 09/25/14 10:44 PM  Result Value Ref Range   C difficile by pcr NEGATIVE NEGATIVE  Glucose, capillary     Status: Abnormal   Collection Time: 09/25/14 11:17 PM  Result Value Ref Range   Glucose-Capillary 232 (H) 65 - 99 mg/dL  Glucose, capillary     Status: Abnormal   Collection Time: 09/26/14  3:12 AM  Result Value Ref Range   Glucose-Capillary 181 (H) 65 - 99 mg/dL  CBC     Status: Abnormal   Collection Time: 09/26/14  3:15 AM  Result Value Ref Range   WBC 25.7 (H) 4.0 - 10.5 K/uL    Comment: REPEATED TO VERIFY [place] COUNT CONFIRMED ON SMEAR    RBC 3.02 (L) 4.22 - 5.81 MIL/uL  Hemoglobin 8.3 (L) 13.0 - 17.0 g/dL   HCT 26.8 (L) 39.0 - 52.0 %   MCV 88.7 78.0 - 100.0 fL   MCH 27.5 26.0 - 34.0 pg   MCHC 31.0 30.0 - 36.0 g/dL   RDW 15.3 11.5 - 15.5 %   Platelets 367 150 - 400 K/uL    Comment: REPEATED TO VERIFY PLATELET COUNT CONFIRMED BY SMEAR   Basic metabolic panel     Status: Abnormal   Collection Time: 09/26/14  3:15 AM  Result Value Ref Range   Sodium 137 135 - 145 mmol/L   Potassium 4.7 3.5 - 5.1 mmol/L   Chloride 97 (L) 101 - 111 mmol/L   CO2 21 (L) 22 - 32 mmol/L   Glucose, Bld 208 (H) 65 - 99 mg/dL   BUN 67 (H) 6 - 20 mg/dL   Creatinine, Ser 3.48 (H) 0.61 - 1.24 mg/dL   Calcium 9.6 8.9 - 10.3 mg/dL   GFR calc non Af Amer 17 (L) >60 mL/min   GFR  calc Af Amer 20 (L) >60 mL/min    Comment: (NOTE) The eGFR has been calculated using the CKD EPI equation. This calculation has not been validated in all clinical situations. eGFR's persistently <60 mL/min signify possible Chronic Kidney Disease.    Anion gap 19 (H) 5 - 15  Glucose, capillary     Status: Abnormal   Collection Time: 09/26/14  8:58 AM  Result Value Ref Range   Glucose-Capillary 291 (H) 65 - 99 mg/dL  Glucose, capillary     Status: Abnormal   Collection Time: 09/26/14 11:14 AM  Result Value Ref Range   Glucose-Capillary 286 (H) 65 - 99 mg/dL   Ct Head Wo Contrast  09/26/2014   CLINICAL DATA:  69 year old diabetic hypertensive male with hyperlipidemia and nonischemic cardiomyopathy presenting with unresponsiveness. Subsequent encounter.  EXAM: CT HEAD WITHOUT CONTRAST  TECHNIQUE: Contiguous axial images were obtained from the base of the skull through the vertex without intravenous contrast.  COMPARISON:  09/24/2014 head CT.  FINDINGS: Left occipital lobe abnormality suggestive of acute infarct without associated hemorrhage. Other causes such as that related to asymmetric posterior reversible encephalopathy syndrome felt to be a secondary less likely consideration. The appearance is not typical for an infectious/ metabolic encephalopathy.  Global atrophy without hydrocephalus.  No intracranial mass lesion noted on this unenhanced exam.  Mild exophthalmos.  IMPRESSION: Left occipital lobe nonhemorrhagic infarct suspected. Please see above.  These results will be called to the ordering clinician or representative by the Radiologist Assistant, and communication documented in the PACS or zVision Dashboard.   Electronically Signed   By: Genia Del M.D.   On: 09/26/2014 13:20   Ct Head Wo Contrast  09/24/2014   CLINICAL DATA:  Delirium, on vent, combative  EXAM: CT HEAD WITHOUT CONTRAST  TECHNIQUE: Contiguous axial images were obtained from the base of the skull through the vertex  without intravenous contrast.  COMPARISON:  09/19/2014  FINDINGS: Motion degraded images.  No evidence of parenchymal hemorrhage or extra-axial fluid collection. No mass lesion, mass effect, or midline shift.  No CT evidence of acute infarction.  Subcortical Copland matter and periventricular small vessel ischemic changes. Intracranial atherosclerosis.  Global cortical atrophy.  Secondary ventriculomegaly.  The visualized paranasal sinuses are essentially clear. Partial opacification of the right mastoid air cells.  No evidence of calvarial fracture.  IMPRESSION: Motion degraded images.  No evidence of acute intracranial abnormality.  Atrophy with small vessel ischemic changes.  Electronically Signed   By: Julian Hy M.D.   On: 09/24/2014 14:35   Dg Chest Port 1 View  09/25/2014   CLINICAL DATA:  Delirium  EXAM: PORTABLE CHEST - 1 VIEW  COMPARISON:  09/24/2014  FINDINGS: There is a right IJ catheter. The tip is in the SVC. The ET tube tip is above the carina. Left IJ catheter tip is in the projection of the SVC. NG tube tip is in the stomach. Normal heart size. No pleural effusion or edema.  IMPRESSION: 1. Stable position of support apparatus. 2. Clear lungs.   Electronically Signed   By: Kerby Moors M.D.   On: 09/25/2014 07:29    Assessment: 69 y.o. male with a history of cardiomyopathy, diabetes mellitus and hypertension, admitted with respiratory failure. Patient is currently encephalopathic secondary to multiple factors, including possible acute infectious process, renal failure and acute left occipital stroke.  Stroke Risk Factors - diabetes mellitus and hypertension  Plan: 1. HgbA1c, fasting lipid panel 2. MRI, MRA  of the brain without contrast 3. PT consult, OT consult, Speech consult 4. Echocardiogram 5. Carotid dopplers 6. Prophylactic therapy-Antiplatelet med: Aspirin  7. Risk factor modification 8. EEG, to assess severity of encephalopathy as well as rule out this epilepticus,  is pending.  C.R. Nicole Kindred, MD [facility] [phone]  09/26/2014, 2:10 PM

## 2014-09-26 NOTE — Procedures (Signed)
EEG report.  Brief clinical history: 69 y.o. male with a history of cardiomyopathy, diabetes mellitus and hypertension, admitted with respiratory failure. Patient is currently encephalopathic secondary to multiple factors, including possible acute infectious process, renal failure and acute left occipital stroke.   Technique: this is a 17 channel routine scalp EEG performed at the bedside in the ICU, with bipolar and monopolar montages arranged in accordance to the international 10/20 system of electrode placement. One channel was dedicated to EKG recording.  The patient is unresponsive, intubated on the vent. No activating procedures performed.  Description: as the study begins and throughout the entire recording, there is generalized, continuous, monomorphic, and unreactive  low amplitude delta frequency that doesn't follow an ictal pattern at any time. No focal or generalized epileptiform discharges noted.  EKG showed sinus rhythm.  Impression: this is an abnormal EEG due to the above described findings. The study is indicative of a severe but no specific encephalopathy. No electrographic seizures noted. Clinical correlation is advised.   Roger Portela, MD [facility]

## 2014-09-26 NOTE — Progress Notes (Signed)
Inpatient Diabetes Program Recommendations  AACE/ADA: New Consensus Statement on Inpatient Glycemic Control (2013)  Target Ranges:  Prepandial:   less than 140 mg/dL      Peak postprandial:   less than 180 mg/dL (1-2 hours)      Critically ill patients:  140 - 180 mg/dL   Results for Roger Ashley, Roger Ashley (MRN [id number]) as of 09/26/2014 09:34  Ref. Range 09/24/2014 23:21 09/25/2014 03:23 09/25/2014 07:39 09/25/2014 12:10 09/25/2014 15:55 09/25/2014 20:38 09/25/2014 23:17 09/26/2014 03:12 09/26/2014 08:58  Glucose-Capillary Latest Ref Range: 65-99 mg/dL 158 (H) 682 (H) 574 (H) 188 (H) 182 (H) 232 (H) 232 (H) 181 (H) 291 (H)   Diabetes history: DM2 Outpatient Diabetes medications: Glipizide 10 mg BID Current orders for Inpatient glycemic control: Lantus 5 units QHS, Novolog 0-20 units Q4H  Inpatient Diabetes Program Recommendations Inpatient Glycemic Control Order Set: Since patient is intubated, is NPO, on tube feeds, and CBGs have ranged from 120-291 mg/dl over the past 24 hours recommend discontinuing current Novolog 0-20 units Q4H per regular glycemic control order set and changing patient to ICU Glycemic Control order for improved glycemic control.  Insulin - Basal: Please consider increasing Lantus to 15 units QHS (based on 83 kg x 0.2 units)  Thanks, Orlando Penner, RN, MSN, CCRN, CDE Diabetes Coordinator Inpatient Diabetes Program [phone] (Team Pager from 8am to 5pm) [phone] ([facility] office) [phone] [facility] office) [phone] [facility] office)

## 2014-09-26 NOTE — Progress Notes (Signed)
Sputum sample obtained and sent down to main lab without any complications.  

## 2014-09-26 NOTE — Progress Notes (Signed)
Patient transported to CT and back to room 2M02 without any complications.

## 2014-09-26 NOTE — Progress Notes (Signed)
Bedside EEG completed, results pending. 

## 2014-09-26 NOTE — Progress Notes (Signed)
PULMONARY / CRITICAL CARE MEDICINE HISTORY AND PHYSICAL EXAMINATION   Name: Roger Ashley MRN: [id number] DOB: 09-27-1945    ADMISSION DATE:  2014-10-10  PRIMARY SERVICE: PCCM  CHIEF COMPLAINT:  SOB  BRIEF PATIENT DESCRIPTION: 69 M with EF 40-45%, CKD 4, DM, HTN who presented to [facility] with worsening SOB 2/2 ARDS vs aCHF and eventually required intubation. Transferred to [facility] for further management.   SIGNIFICANT EVENTS / STUDIES:  5/20: Admit with respiratory distress, ABG on admission 7.336/36.2/69.2 (on BiPAP).  Intubated, ARDS protocol  5/22: Not synchronous on vent, CXR improved.  ARDS protocol d/c'd, f/u ABG >> 7.15/ 63/79/22.  Nimbex gtt started. Poorly controlled CBGs --> transitioned to resistant SSI 5/23: Off Nimbex and Levo during the day, Levo restarted overnight briefly. Pt able to open eyes to name. 5/25: Still with volume on board. Lasix given. Off paralytic, agitated off sedation.  5/26: Worsening renal fx but with good uop.  5/27 >renal consult >CRRT started  5/29 >CT head >no acute process  5/30: Neg I/O Bal with CRRT ,scr tr down, oliguric to anuric  Weaned yesterday PS 5/5 for ~6 hr Off all sedation for 24hr  More responsive -turned head to name and wiggled toes to command >CT head yesterday neg for acute    SUBJECTIVE/OVERNIGHT/INTERVAL HX 09/26/14: Last sedation gtt and prn 09/20/14 , last prn sedation -> last night apparently followed commands per RN. This AM RN reports -> RASS -3. Cough + , Gag +. Sync with vent and doing SBT. Per RN -> Renal wants to give CVVH holiday since 1am. Anuric +. Has flexiseal -> C Diff negative. Not on pressors   Per Wife:  Concern and she is upset  that patient is anuric. Feels anuria related to starting of CVVH. Reports edema of scalp and head (not confirmed per MD). Seems very upset at his course esp fact he is obtunded and is a decline today   VITAL SIGNS: Temp:  [97.4 F (36.3 C)-98.3 F (36.8 C)] 98.3 F (36.8 C) (05/31  0901) Pulse Rate:  [76-95] 90 (05/31 0823) Resp:  [13-30] 25 (05/31 0800) BP: (82-126)/(41-100) 84/46 mmHg (05/31 0800) SpO2:  [99 %-100 %] 100 % (05/31 0800) FiO2 (%):  [40 %] 40 % (05/31 0823) Weight:  [83.6 kg (184 lb 4.9 oz)] 83.6 kg (184 lb 4.9 oz) (05/31 0424)   HEMODYNAMICS:     VENTILATOR SETTINGS: Vent Mode:  [-] PSV;CPAP FiO2 (%):  [40 %] 40 % Set Rate:  [15 bmp] 15 bmp Vt Set:  [510 mL] 510 mL PEEP:  [5 cmH20] 5 cmH20 Pressure Support:  [5 cmH20] 5 cmH20 Plateau Pressure:  [18 cmH20-19 cmH20] 19 cmH20   INTAKE / OUTPUT: Intake/Output      05/30 0701 - 05/31 0700 05/31 0701 - 06/01 0700   NG/GT 815 50   IV Piggyback 617    Total Intake(mL/kg) 1432 (17.1) 50 (0.6)   Urine (mL/kg/hr) 0 (0)    Other 3020 (1.5)    Stool 1025 (0.5)    Total Output 4045     Net -2613 +50        Stool Occurrence 1 x      PHYSICAL EXAMINATION: General: critically ill looking HEENT: OETT in place, jvd down Neck: Trachea supple and midline Cardiovascular:  RRR  Lungs: Decreased BS in bases  Abdomen: S, NT, ND Musculoskeletal: No acute deformities  Skin: Warm/dry, no appreciable edema. LUE AVF, bruit and thrill present. MD cannot confirm edema in scalp/head Neuro: no  sedation, not following commands , RASS =4, doing SBT   LABS:  PULMONARY No results for input(s): PHART, PCO2ART, PO2ART, HCO3, TCO2, O2SAT in the last 168 hours.  Invalid input(s): PCO2, PO2  CBC  Recent Labs Lab 09/24/14 0515 09/25/14 0430 09/26/14 0315  HGB 7.4* 7.6* 8.3*  HCT 24.3* 24.9* 26.8*  WBC 12.9* 16.6* 25.7*  PLT 280 346 367    COAGULATION  Recent Labs Lab 09/22/14 1300  INR 1.29    CARDIAC  No results for input(s): TROPONINI in the last 168 hours. No results for input(s): PROBNP in the last 168 hours.   CHEMISTRY  Recent Labs Lab 09/24/14 0515 09/24/14 0524 09/24/14 1721 09/25/14 0430 09/25/14 1925 09/26/14 0315  NA 137  --  136 137 134* 137  K 4.7  --  4.6 4.7 4.5 4.7   CL 101  --  101 100* 98* 97*  CO2 28  --  25 26 23  21*  GLUCOSE 119*  --  181* 153* 283* 208*  BUN 67*  --  67* 59* 58* 67*  CREATININE 3.12*  --  3.42* 2.98* 2.82* 3.48*  CALCIUM 8.6*  --  8.8* 9.0 9.1 9.6  MG  --  2.8*  --   --   --   --   PHOS  --  4.8* 5.3*  --  5.3*  --    Estimated Creatinine Clearance: 21.2 mL/min (by C-G formula based on Cr of 3.48).   LIVER  Recent Labs Lab 09/22/14 1300 09/24/14 1721 09/25/14 1925  ALBUMIN  --  2.5* 2.8*  INR 1.29  --   --      INFECTIOUS No results for input(s): LATICACIDVEN, PROCALCITON in the last 168 hours.   ENDOCRINE CBG (last 3)   Recent Labs  09/25/14 2317 09/26/14 0312 09/26/14 0858  GLUCAP 232* 181* 291*         IMAGING x48h  - image(s) personally visualized  -   highlighted in bold Ct Head Wo Contrast  09/24/2014   CLINICAL DATA:  Delirium, on vent, combative  EXAM: CT HEAD WITHOUT CONTRAST  TECHNIQUE: Contiguous axial images were obtained from the base of the skull through the vertex without intravenous contrast.  COMPARISON:  09/19/2014  FINDINGS: Motion degraded images.  No evidence of parenchymal hemorrhage or extra-axial fluid collection. No mass lesion, mass effect, or midline shift.  No CT evidence of acute infarction.  Subcortical Rosenau matter and periventricular small vessel ischemic changes. Intracranial atherosclerosis.  Global cortical atrophy.  Secondary ventriculomegaly.  The visualized paranasal sinuses are essentially clear. Partial opacification of the right mastoid air cells.  No evidence of calvarial fracture.  IMPRESSION: Motion degraded images.  No evidence of acute intracranial abnormality.  Atrophy with small vessel ischemic changes.   Electronically Signed   By: Charline Bills M.D.   On: 09/24/2014 14:35   Dg Chest Port 1 View  09/25/2014   CLINICAL DATA:  Delirium  EXAM: PORTABLE CHEST - 1 VIEW  COMPARISON:  09/24/2014  FINDINGS: There is a right IJ catheter. The tip is in the SVC.  The ET tube tip is above the carina. Left IJ catheter tip is in the projection of the SVC. NG tube tip is in the stomach. Normal heart size. No pleural effusion or edema.  IMPRESSION: 1. Stable position of support apparatus. 2. Clear lungs.   Electronically Signed   By: Signa Kell M.D.   On: 09/25/2014 07:29        ASSESSMENT /  PLAN:  PULMONARY A: Acute hypoxemic respiratory failure - DDx includes ARDS vs acute heart failure.  BNP 547 on admit.  More likely was ARDS Diffuse Bilateral Airspace Disease >lungs clear on CXR 5/30  09/26/2014: doing SBT but mental status precludes extubation   P:   Wean as tolerated  Will need improved neurostatus for extubation success; not for extubation 09/26/2014 pcxr in am       CARDIOVASCULAR L IJ TLC 5/20 >>  A: Chronic Systolic HF- Per chart review most care at [facility].  H/o HTN Off pressors since 5/25   - maintaining BP/HR (got bolus fluid overnight)  P:   Labetalol BID Hydralazine Three times a day   ICU monitoring  ASA   RENAL A: AKI on CKD - Likely 2/2 aggressive diuresis and ATN>>CRRT started 5/27   Has inmature fistula 5/30 >scr tr down , near anuric  , neg bal with CRRT   - continues to be anuric. Wife upset that he is anuric. Seems to indicate that anuria temporally followed CVVH onset and suspects CVVH cause of anuria  P:   D/w Dr Allena Katz of renal - he will address wife concerns CRRT per  Renal  (holiday for 09/26/14) Monitor electrolytes   GASTROINTESTINAL A: Vent Associated Dysphagia 5/30 >TF residuals improved and BM improved , off miralax 5/28    P:  Continue TF: Oxepa   PPI  Bowel regimen: Colace As needed    HEMATOLOGIC  Recent Labs Lab 09/24/14 0515 09/25/14 0430 09/26/14 0315  HGB 7.4* 7.6* 8.3*  HCT 24.3* 24.9* 26.8*  WBC 12.9* 16.6* 25.7*  PLT 280 346 367    A: Anemia - Likely AOCD Hgb unchanged , no active bleeding  Jehovah witness and will not take blood   P:   CBC in AM DVT  PPx: [place] Heparin No transfusion pf PRBC due to faith reasons  INFECTIOUS Sputum Culture 5/21 >>not done  Blood Culture x 2 5/20 >>NEG  Strep Pneumo 5/22 >> neg   SToll C Dif 09/25/14 - negative  A: HCAP vs CAP --> initially on broad coverage due to unclear medical background Narrowed off Vanc and Cefepime 5/23 5/30 >CXR lungs clear    - off all abx since 09/24/14 but wbc rising. Stool C Diff negative P:   Vanco, start date 5/21>>>5/23 Cefepime, start date 5/21>>>5/23  Azithro, start date 5/20>>> 5/25 Ceftriaxone, start date 5/23>> 5/29 .................. Blood culture 09/26/14 Trach aspirate 5/31?16   >remain off abx, trend wbc/temp curve  ENDOCRINE A: DM Improved CBGs with resistant SSI and addition of Lantus   09/26/14: Sugars 200s  P:   Resistant SSI Continue Lantus 5u; increase 2014/10/17 depending on course    NEUROLOGIC A: ICU Associated Discomfort Delirium   5/30 repeat CT head 5/29 neg for acute process , ammonia nml , B12 high   09/26/14: RASS -4. Wife and RN report is worse. Wife upset  P:  Neuro consult Dr Roseanne Reno called Monitor neuro status  WUA Dc fentanyl      FAMILY: updated 5/30 bedside by McQuaid. Wife and Dr Marchelle Gearing discussed 09/26/14 in presence of RN Irving Burton: currently wife did not indicate desire for terminal wean but wants to understand CNS and renal issues better    The patient is critically ill with multiple organ systems failure and requires high complexity decision making for assessment and support, frequent evaluation and titration of therapies, application of advanced monitoring technologies and extensive interpretation of multiple databases.   Critical Care Time  devoted to patient care services described in this note is  35  Minutes. This time reflects time of care of this signee Dr Kalman Shan. This critical care time does not reflect procedure time, or teaching time or supervisory time of PA/NP/Med student/Med Resident etc but  could involve care discussion time    Dr. Kalman Shan, M.D., [facility].C.P Pulmonary and Critical Care Medicine Staff Physician [facility]  Pulmonary and Critical Care Pager: [phone], If no answer or between  15:00h - 7:00h: call [phone]  09/26/2014 9:52 AM

## 2014-09-27 ENCOUNTER — Inpatient Hospital Stay (HOSPITAL_COMMUNITY): Payer: Commercial Managed Care - HMO

## 2014-09-27 DIAGNOSIS — I6340 Cerebral infarction due to embolism of unspecified cerebral artery: Secondary | ICD-10-CM | POA: Insufficient documentation

## 2014-09-27 DIAGNOSIS — I639 Cerebral infarction, unspecified: Secondary | ICD-10-CM | POA: Insufficient documentation

## 2014-09-27 LAB — TROPONIN I: TROPONIN I: 0.12 ng/mL — AB (ref <0.031)

## 2014-09-27 LAB — CORTISOL: Cortisol, Plasma: 91.6 ug/dL

## 2014-09-27 LAB — HEPATIC FUNCTION PANEL
ALT: 890 U/L — ABNORMAL HIGH (ref 17–63)
AST: 1150 U/L — AB (ref 15–41)
Albumin: 2.6 g/dL — ABNORMAL LOW (ref 3.5–5.0)
Alkaline Phosphatase: 102 U/L (ref 38–126)
BILIRUBIN DIRECT: 0.2 mg/dL (ref 0.1–0.5)
BILIRUBIN INDIRECT: 0.9 mg/dL (ref 0.3–0.9)
BILIRUBIN TOTAL: 1.1 mg/dL (ref 0.3–1.2)
TOTAL PROTEIN: 7.3 g/dL (ref 6.5–8.1)

## 2014-09-27 LAB — BASIC METABOLIC PANEL
Anion gap: 25 — ABNORMAL HIGH (ref 5–15)
BUN: 77 mg/dL — AB (ref 6–20)
CO2: 15 mmol/L — AB (ref 22–32)
Calcium: 9.1 mg/dL (ref 8.9–10.3)
Chloride: 99 mmol/L — ABNORMAL LOW (ref 101–111)
Creatinine, Ser: 4.21 mg/dL — ABNORMAL HIGH (ref 0.61–1.24)
GFR, EST AFRICAN AMERICAN: 16 mL/min — AB (ref >60)
GFR, EST NON AFRICAN AMERICAN: 13 mL/min — AB (ref >60)
Glucose, Bld: 85 mg/dL (ref 65–99)
POTASSIUM: 4.5 mmol/L (ref 3.5–5.1)
Sodium: 139 mmol/L (ref 135–145)

## 2014-09-27 LAB — PHOSPHORUS: Phosphorus: 8.2 mg/dL — ABNORMAL HIGH (ref 2.5–4.6)

## 2014-09-27 LAB — CBC
HEMATOCRIT: 25.6 % — AB (ref 39.0–52.0)
HEMOGLOBIN: 8.2 g/dL — AB (ref 13.0–17.0)
MCH: 28.1 pg (ref 26.0–34.0)
MCHC: 32 g/dL (ref 30.0–36.0)
MCV: 87.7 fL (ref 78.0–100.0)
PLATELETS: 400 10*3/uL (ref 150–400)
RBC: 2.92 MIL/uL — ABNORMAL LOW (ref 4.22–5.81)
RDW: 15.7 % — AB (ref 11.5–15.5)
WBC: 40.7 10*3/uL — ABNORMAL HIGH (ref 4.0–10.5)

## 2014-09-27 LAB — LACTIC ACID, PLASMA: LACTIC ACID, VENOUS: 10.8 mmol/L — AB (ref 0.5–2.0)

## 2014-09-27 LAB — GLUCOSE, CAPILLARY
GLUCOSE-CAPILLARY: 62 mg/dL — AB (ref 65–99)
Glucose-Capillary: 177 mg/dL — ABNORMAL HIGH (ref 65–99)
Glucose-Capillary: 132 mg/dL — ABNORMAL HIGH (ref 65–99)
Glucose-Capillary: 74 mg/dL (ref 65–99)
Glucose-Capillary: 192 mg/dL — ABNORMAL HIGH (ref 65–99)

## 2014-09-27 LAB — LIPASE, BLOOD: Lipase: 101 U/L — ABNORMAL HIGH (ref 22–51)

## 2014-09-27 LAB — MAGNESIUM: Magnesium: 3.3 mg/dL — ABNORMAL HIGH (ref 1.7–2.4)

## 2014-09-27 MED ORDER — ATROPINE SULFATE 0.1 MG/ML IJ SOLN
INTRAMUSCULAR | Status: AC
  Filled 2014-09-27: qty 10

## 2014-09-27 MED ORDER — STERILE WATER FOR INJECTION IV SOLN
INTRAVENOUS | Status: DC
  Administered 2014-09-27: 08:00:00 via INTRAVENOUS_CENTRAL
  Filled 2014-09-27 (×6): qty 150

## 2014-09-27 MED ORDER — VANCOMYCIN HCL 10 G IV SOLR
1500.0000 mg | Freq: Once | INTRAVENOUS | Status: AC
  Administered 2014-09-27: 1500 mg via INTRAVENOUS
  Filled 2014-09-27: qty 1500

## 2014-09-27 MED ORDER — VANCOMYCIN HCL IN DEXTROSE 1-5 GM/200ML-% IV SOLN: 1000.0000 mg | INTRAVENOUS | Status: DC

## 2014-09-27 MED ORDER — DEXTROSE 50 % IV SOLN
INTRAVENOUS | Status: AC
  Administered 2014-09-27: 50 mL
  Filled 2014-09-27: qty 50

## 2014-09-27 MED ORDER — PIPERACILLIN-TAZOBACTAM 3.375 G IVPB 30 MIN
3.3750 g | Freq: Four times a day (QID) | INTRAVENOUS | Status: DC
  Administered 2014-09-27: 3.375 g via INTRAVENOUS
  Filled 2014-09-27 (×4): qty 50

## 2014-09-27 MED ORDER — DEXTROSE 5 % IV SOLN
0.0000 ug/min | INTRAVENOUS | Status: DC
  Administered 2014-09-27: 5 ug/min via INTRAVENOUS
  Filled 2014-09-27: qty 16

## 2014-09-27 MED ORDER — HYDROCORTISONE NA SUCCINATE PF 100 MG IJ SOLR
50.0000 mg | Freq: Four times a day (QID) | INTRAMUSCULAR | Status: DC
  Administered 2014-09-27: 50 mg via INTRAVENOUS
  Filled 2014-09-27: qty 2

## 2014-09-27 DEATH — deceased

## 2014-09-28 LAB — GLUCOSE, CAPILLARY: Glucose-Capillary: 88 mg/dL (ref 65–99)

## 2014-09-28 LAB — CULTURE, RESPIRATORY W GRAM STAIN: Special Requests: NORMAL

## 2014-09-29 ENCOUNTER — Telehealth: Payer: Self-pay

## 2014-09-29 LAB — CULTURE, BLOOD (ROUTINE X 2)

## 2014-09-29 NOTE — Telephone Encounter (Signed)
On 09/29/2014 I received a death certificate from [employer]. This patient is a patient of [employer]. The certificate is for cremation. The certificate will be taken to [facility] ([id number]) for signature this afternoon. On 10/03/14 I received the death certificate back completed from Doctor Ramaswamy. I called the funeral home to let them know the death certificate was ready for pickup.

## 2014-10-02 LAB — CULTURE, BLOOD (ROUTINE X 2): Culture: NO GROWTH

## 2014-10-07 DIAGNOSIS — Z66 Do not resuscitate: Secondary | ICD-10-CM

## 2014-10-07 NOTE — Discharge Summary (Signed)
DISCHARGE SUMMARY    Date of admit: Oct 08, 2014  1:05 PM Date of discharge: 10/24/2014  2:40 PM Length of Stay: 12 days  PCP is HAWKINS,EDWARD L, MD   PROBLEM LIST Principal Problem:   Acute respiratory failure due to ARDS with septic shock due to pneumonia NOS at admit and later  stroke Septic shock Acute encephalopathy with stroke due to medical illness and then due to stroke   Delirium    Acute encephalopathy    Cerebral embolism with cerebral infarction    Occipital infarction    Acute Renal Failure on Chronic Renal Failure  Other    Non-ischemic cardiomyopathy   Diabetes mellitus, type 2   Hyperlipidemia   Hypertension      SUMMARY Roger Ashley was 69 y.o. patient with    has a past medical history of Nonischemic cardiomyopathy (2002); Diabetes mellitus type II; Hyperlipidemia; Essential hypertension; CKD (chronic kidney disease) stage 4, GFR 15-29 ml/min; Erectile dysfunction; Hearing impairment; Anemia; Acute respiratory failure; CAP (community acquired pneumonia); and Sepsis.   has past surgical history that includes Colonoscopy w/ polypectomy and AV fistula placement.   Admitted on 10/08/14 with    82 M with EF 40-45%, CKD 4, DM, HTN who presented to [facility] with worsening SOB 2/2 ARDS vs aCHF and eventually required intubation. Transferred to [facility] for further management. Admiited October 08, 2014   SIGNIFICANT EVENTS / STUDIES:  5/20: Admit with respiratory distress, ABG on admission 7.336/36.2/69.2 (on BiPAP). Intubated, ARDS protocol  5/22: Not synchronous on vent, CXR improved. ARDS protocol d/c'd, f/u ABG >> 7.15/ 63/79/22. Nimbex gtt started. Poorly controlled CBGs --> transitioned to resistant SSI 5/23: Off Nimbex and Levo during the day, Levo restarted overnight briefly. Pt able to open eyes to name. 5/25: Still with volume on board. Lasix given. Off paralytic, agitated off sedation.  5/26: Worsening renal fx but with good uop.  5/27 >renal  consult >CRRT started  5/29 >CT head >no acute process  5/30: Neg I/O Bal with CRRT ,scr tr down, oliguric to anuric  Weaned yesterday PS 5/5 for ~6 hr Off all sedation for 24hr  More responsive -turned head to name and wiggled toes to command >CT head yesterday neg for acute  09/26/14: Last sedation gtt and prn 09/20/14 , last prn sedation -> last night apparently followed commands per RN. This AM RN reports -> RASS -3. Cough + , Gag +. Sync with vent and doing SBT. Per RN -> Renal wants to give CVVH holiday since 1am. Anuric +. Has flexiseal -> C Diff negative. Not on pressors    10/03/2014: RASS -3 off sedation (opens eyes occ but no follow commands, moves ext) continues. CT with occipital stroke. Per RN neuro has appraised wife. Expect visual deficits long term and possible balance issues. Otherwise: hhypothermic, this AM in shock needing levophed, rising WBC, but no diarrhea anymore. Back on CVVH since last night. Also, vomited last night. Reports from nursing: wife continues to be upset at situation more than at providers    PAtient re-evaluated by Dr Pearlean Brownie concern for more extensive stroke esp in posterior circulation involving brain stem. Plan made for CT angiogram. Prognosis considered guarded. DNR status but full medical care confirmed 10/26/2014. Patoient had worsening circulator shock. LActate resulted at 10.8. LFTs showed shock liver. Lipase marginally up. Prognosis changed from guarded to grim  While waiting for CT angio patient developed cardiac arrest and expired    SIGNED Dr. Kalman Shan, M.D., [facility].C.P Pulmonary and Critical Care Medicine Staff  Physician [facility] Pulmonary and Critical Care Pager: [phone], If no answer or between  15:00h - 7:00h: call [phone]  10/07/2014 6:28 PM

## 2014-10-27 NOTE — Progress Notes (Signed)
PULMONARY / CRITICAL CARE MEDICINE HISTORY AND PHYSICAL EXAMINATION   Name: Roger Ashley MRN: [id number] DOB: March 14, 1946    ADMISSION DATE:  09/23/2014  PRIMARY SERVICE: PCCM  CHIEF COMPLAINT:  SOB  BRIEF PATIENT DESCRIPTION: 69 M with EF 40-45%, CKD 4, DM, HTN who presented to [facility] with worsening SOB 2/2 ARDS vs aCHF and eventually required intubation. Transferred to [facility] for further management.   SIGNIFICANT EVENTS / STUDIES:  5/20: Admit with respiratory distress, ABG on admission 7.336/36.2/69.2 (on BiPAP).  Intubated, ARDS protocol  5/22: Not synchronous on vent, CXR improved.  ARDS protocol d/c'd, f/u ABG >> 7.15/ 63/79/22.  Nimbex gtt started. Poorly controlled CBGs --> transitioned to resistant SSI 5/23: Off Nimbex and Levo during the day, Levo restarted overnight briefly. Pt able to open eyes to name. 5/25: Still with volume on board. Lasix given. Off paralytic, agitated off sedation.  5/26: Worsening renal fx but with good uop.  5/27 >renal consult >CRRT started  5/29 >CT head >no acute process  5/30: Neg I/O Bal with CRRT ,scr tr down, oliguric to anuric  Weaned yesterday PS 5/5 for ~6 hr Off all sedation for 24hr  More responsive -turned head to name and wiggled toes to command >CT head yesterday neg for acute  09/26/14: Last sedation gtt and prn 09/20/14 , last prn sedation -> last night apparently followed commands per RN. This AM RN reports -> RASS -3. Cough + , Gag +. Sync with vent and doing SBT. Per RN -> Renal wants to give CVVH holiday since 1am. Anuric +. Has flexiseal -> C Diff negative. Not on pressors   Per Wife:  Concern and she is upset  that patient is anuric. Feels anuria related to starting of CVVH. Reports edema of scalp and head (not confirmed per MD). Seems very upset at his course esp fact he is obtunded and is a decline today    SUBJECTIVE/OVERNIGHT/INTERVAL HX 10/17/2014: RASS -3 off sedation (opens eyes occ but no follow commands, moves ext) continues. CT  with occipital stroke. Per RN neuro has appraised wife. Expect visual deficits long term and possible balance issues. Otherwise: hhypothermic, this AM in shock needing levophed, rising WBC, but no diarrhea anymore. Back on CVVH since last night. Also, vomited last night. Reports from nursing: wife continues to be upset at situation more than at providers   VITAL SIGNS: Temp:  [94.5 F (34.7 C)-98.7 F (37.1 C)] 94.5 F (34.7 C) (06/01 0700) Pulse Rate:  [74-95] 74 (06/01 0635) Resp:  [17-32] 22 (06/01 0700) BP: (59-132)/(37-100) 59/37 mmHg (06/01 0700) SpO2:  [90 %-100 %] 97 % (06/01 0635) FiO2 (%):  [30 %-40 %] 30 % (06/01 0818) Weight:  [188 lb 0.8 oz (85.3 kg)] 188 lb 0.8 oz (85.3 kg) (06/01 0500)   HEMODYNAMICS:     VENTILATOR SETTINGS: Vent Mode:  [-] PRVC FiO2 (%):  [30 %-40 %] 30 % Set Rate:  [15 bmp] 15 bmp Vt Set:  [510 mL] 510 mL PEEP:  [5 cmH20] 5 cmH20 Plateau Pressure:  [9 cmH20-17 cmH20] 9 cmH20   INTAKE / OUTPUT: Intake/Output      05/31 0701 - 06/01 0700 06/01 0701 - 06/02 0700   NG/GT 405    IV Piggyback     Total Intake(mL/kg) 405 (4.7)    Urine (mL/kg/hr)     Emesis/NG output 400 (0.2)    Other 74 (0) 2 (0)   Stool 600 (0.3)    Total Output 1074 2   Net -  669 -2        Stool Occurrence 1 x      PHYSICAL EXAMINATION: General: critically ill looking, bair hugger on HEENT: OETT in place, jvd down Neck: Trachea supple and midline Cardiovascular:  RRR  Lungs: Decreased BS in bases  Abdomen: S, NT, ND Musculoskeletal: No acute deformities  Skin: Warm/dry, no appreciable edema. LUE AVF, bruit and thrill present. MD cannot confirm edema in scalp/head Neuro: no sedation, not following commands , RASS -4,    LABS:  PULMONARY No results for input(s): PHART, PCO2ART, PO2ART, HCO3, TCO2, O2SAT in the last 168 hours.  Invalid input(s): PCO2, PO2  CBC  Recent Labs Lab 09/25/14 0430 09/26/14 0315 10/02/2014 0451  HGB 7.6* 8.3* 8.2*  HCT 24.9* 26.8*  25.6*  WBC 16.6* 25.7* 40.7*  PLT 346 367 400    COAGULATION  Recent Labs Lab 09/22/14 1300  INR 1.29    CARDIAC  No results for input(s): TROPONINI in the last 168 hours. No results for input(s): PROBNP in the last 168 hours.   CHEMISTRY  Recent Labs Lab 09/24/14 0524 09/24/14 1721 09/25/14 0430 09/25/14 1925 09/26/14 0315 09/26/14 1415 10/24/2014 0451  NA  --  136 137 134* 137 138 139  K  --  4.6 4.7 4.5 4.7 5.2* 4.5  CL  --  101 100* 98* 97* 99* 99*  CO2  --  21* 16* 15*  GLUCOSE  --  181* 153* 283* 208* 282* 85  BUN  --  67* 59* 58* 67* 103* 77*  CREATININE  --  3.42* 2.98* 2.82* 3.48* 5.49* 4.21*  CALCIUM  --  8.8* 9.0 9.1 9.6 9.4 9.1  MG 2.8*  --   --   --   --   --  3.3*  PHOS 4.8* 5.3*  --  5.3*  --   --  8.2*   Estimated Creatinine Clearance: 17.7 mL/min (by C-G formula based on Cr of 4.21).   LIVER  Recent Labs Lab 09/22/14 1300 09/24/14 1721 09/25/14 1925  ALBUMIN  --  2.5* 2.8*  INR 1.29  --   --      INFECTIOUS No results for input(s): LATICACIDVEN, PROCALCITON in the last 168 hours.   ENDOCRINE CBG (last 3)   Recent Labs  10/11/2014 0049 10/08/2014 0317 10/23/2014 0609  GLUCAP 177* 132* 74         IMAGING x48h  - image(s) personally visualized  -   highlighted in bold Ct Head Wo Contrast  09/26/2014   CLINICAL DATA:  69 year old diabetic hypertensive male with hyperlipidemia and nonischemic cardiomyopathy presenting with unresponsiveness. Subsequent encounter.  EXAM: CT HEAD WITHOUT CONTRAST  TECHNIQUE: Contiguous axial images were obtained from the base of the skull through the vertex without intravenous contrast.  COMPARISON:  09/24/2014 head CT.  FINDINGS: Left occipital lobe abnormality suggestive of acute infarct without associated hemorrhage. Other causes such as that related to asymmetric posterior reversible encephalopathy syndrome felt to be a secondary less likely consideration. The appearance is not typical for an  infectious/ metabolic encephalopathy.  Global atrophy without hydrocephalus.  No intracranial mass lesion noted on this unenhanced exam.  Mild exophthalmos.  IMPRESSION: Left occipital lobe nonhemorrhagic infarct suspected. Please see above.  These results will be called to the ordering clinician or representative by the Radiologist Assistant, and communication documented in the PACS or zVision Dashboard.   Electronically Signed   By: Lacy Duverney M.D.   On: 09/26/2014 13:20   Dg  Chest Port 1 View  10/27/2014   CLINICAL DATA:  Intubated patient, shortness of breath, pulmonary edema with acute renal failure, community-acquired pneumonia.  EXAM: PORTABLE CHEST - 1 VIEW  COMPARISON:  Portable chest x-ray of Sep 25, 2014  FINDINGS: The lungs are adequately inflated. The right lung is clear. On the left the interstitial markings are mildly prominent though stable. There is no alveolar infiltrate. The heart and pulmonary vascularity are normal. The endotracheal tube tip lies 3.2 cm above the carina. The esophagogastric tube tip projects below the inferior margin of the image. The right internal jugular venous catheter tip projects over the midportion of the SVC. A left internal jugular venous catheter tip projects at the same level.  IMPRESSION: Stable appearance of the chest since yesterday's study. There is no focal pneumonia. Minimal interstitial prominence on the left may reflect stable low-grade interstitial edema   Electronically Signed   By: David  [place] M.D.   On: October 27, 2014 07:40        ASSESSMENT / PLAN:  PULMONARY A: Acute hypoxemic respiratory failure - DDx includes ARDS vs acute heart failure.  BNP 547 on admit.  More likely was ARDS Diffuse Bilateral Airspace Disease >lungs clear on CXR 5/30 and 2014-10-27  2014-10-27: does not meet SBT criteria due to mental status and circulatory shock   P:   Full vent support Wean as tolerated  Will need improved neurostatus for extubation success pcxr in  am       CARDIOVASCULAR L IJ TLC 5/20 >>  A: Chronic Systolic HF- Per chart review most care at [facility].  H/o HTN Off pressors since 5/25   - in circulatory shock 10/27/2014; likely septic  P:   DcLabetalol BID Dc Hydralazine Three times a day   Levophed for map goal > 65 Hydrocotrisone start x 5 days from 10-27-2014 ICU monitoring  ASA   RENAL A: AKI on CKD - Likely 2/2 aggressive diuresis and ATN>>CRRT started 5/27   Has inmature fistula 5/30 >scr tr down , near anuric  , neg bal with CRRT   - 2014-10-27: Anuria continues . Back on CVVH  P:   CRRT per  Renal   Monitor electrolytes   GASTROINTESTINAL A: Vent Associated Dysphagia  10/27/14: On TF. HAd vomit x 1 yesterday    P:  Continue TF: Oxep PPI  Bowel regimen: Colace As needed  Check Lipase, lft, lactate   HEMATOLOGIC   A: Anemia - Likely AOCD Hgb unchanged , no active bleeding  Jehovah witness and will not take blood   P:   CBC in AM DVT PPx: Rayne Heparin No transfusion pf PRBC due to faith reasons  INFECTIOUS Sputum Culture 5/21 >>not done  Blood Culture x 2 5/20 >>NEG  Strep Pneumo 5/22 >> neg   SToll C Dif 09/25/14 - negative ........... Blood culture 09/26/14 > Trach aspirate 09/26/14 >>  A: HCAP vs CAP --> initially on broad coverage due to unclear medical background Narrowed off Vanc and Cefepime 5/23 5/30 >CXR lungs clear    - off all abx since 09/24/14 but wbc rising. Stool C Diff negative and now hypothermic  P:   Vanco, start date 5/21>>>5/23 Cefepime, start date 5/21>>>5/23  Azithro, start date 5/20>>> 5/25 Ceftriaxone, start date 5/23>> 5/29 .................Marland Kitchen vanc 27-Oct-2014>> Zosyn 10/27/14>> Check lactate  ENDOCRINE A: DM Improved CBGs with resistant SSI and addition of Lantus   09/26/14: Sugars 200s but some improvd October 27, 2014  P:   Resistant SSI Continue Lantus 5u; increase  2014/09/29 depending on course    NEUROLOGIC A: ICU Associated Discomfort Delirium   5/30 repeat CT  head 5/29 neg for acute process , ammonia nml , B12 high   09-29-2014: New dx of occipatl stroke 09/26/14 on CT Head. RASS -3 without sedation. Coma +  P:  Neuro consult Monitor neuro status  WUA Monitor of sedation      FAMILY: updated 5/30 bedside by McQuaid. Wife and Dr Marchelle Gearing discussed 09/26/14 in presence of RN Irving Burton: currently wife did not indicate desire for terminal wean but wants to understand CNS and renal issues better   IDT September 29, 2014: with RN Emilie presence, son and wife: long 20 minute discussion. He has living will to withdraw life support in PVS, brain death or terminally ill written in 2005 but wife says spiritually and personally he has continued with that approach. She wants to honor it and so does son. Explained current critical illness and how different from living will situation but similar with high mortality situation. FOr now Full medical care + DNAR.  They want to see how next few tos everal days evolve before deciding on LTAC/Trach v Terminal wean   The patient is critically ill with multiple organ systems failure and requires high complexity decision making for assessment and support, frequent evaluation and titration of therapies, application of advanced monitoring technologies and extensive interpretation of multiple databases.   Critical Care Time devoted to patient care services described in this note is  60  Minutes. This time reflects time of care of this signee Dr Kalman Shan. This critical care time does not reflect procedure time, or teaching time or supervisory time of PA/NP/Med student/Med Resident etc but could involve care discussion time    Dr. Kalman Shan, M.D., [facility].C.P Pulmonary and Critical Care Medicine Staff Physician [facility] [place] Pulmonary and Critical Care Pager: [phone], If no answer or between  15:00h - 7:00h: call [phone]  2014-09-29 8:25 AM

## 2014-10-27 NOTE — Progress Notes (Signed)
Dr. Marchelle Gearing notified regarding lactic acid of 10.8.  Verbal order received for STAT abdominal x-ray.  Will continue to monitor.  Keitha Butte, RN

## 2014-10-27 NOTE — Progress Notes (Signed)
Patient ID: Roger Ashley, male   DOB: Aug 12, 1945, 69 y.o.   MRN: [id number]  [facility] Progress Note   Assessment/ Plan:   1. 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 The initial plan yesterday was to try and give him a day off of dialysis however, I checked labs again in the afternoon and so rising hyperkalemia/increasing BUN for which CRRT was restarted again. The current goal is to provide clearance without ultrafiltration given his hypotension. I will switch his post filter fluid to isotonic sodium bicarbonate to help treat his metabolic acidosis. 2. Acute hypoxic respiratory failure: ARDS v/s CHF. Currently hypotensive/pressor dependent.  3. Hypotension, worsening leukocytosis: Pancultures done yesterday-may benefit from starting empiric antibody therapy. May need CT abdomen/pelvis to evaluate for possible intra-abdominal pathologies 4. 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 Depressed mentation: sedation lightened and CT head reviewed (chronic changes noted but no acute events)  Subjective:   Events from overnight noted with CT scan yesterday afternoon showing possible left occipital lobe CVA. Started on pressors overnight for hypotension.    Objective:   BP 93/53 mmHg  Pulse 74  Temp(Src) 96.2 F (35.7 C) (Axillary)  Resp 20  Ht 5\' 6"  (1.676 m)  Wt 85.3 kg (188 lb 0.8 oz)  BMI 30.37 kg/m2  SpO2 97%  Physical Exam: Gen: Intubated, unresponsive CVS: Pulse regular in rate and rhythm, S1 and S2 normal Resp: Clear to auscultation, no rales/rhonchi Abd: Soft, obese, nontender and bowel sounds are normal Ext: Trace to 1+ lower extremity edema  Labs: BMET  Recent Labs Lab 09/24/14 0515 09/24/14 0524 09/24/14 1721 09/25/14 0430 09/25/14 1925 09/26/14 0315 09/26/14 1415 10/21/2014 0451  NA 137  --  136 137 134* 137 138 139  K 4.7  --  4.6 4.7 4.5 4.7 5.2*  4.5  CL 101  --  101 100* 98* 97* 99* 99*  CO2 28  --  25 26 23  21* 16* 15*  GLUCOSE 119*  --  181* 153* 283* 208* 282* 85  BUN 67*  --  67* 59* 58* 67* 103* 77*  CREATININE 3.12*  --  3.42* 2.98* 2.82* 3.48* 5.49* 4.21*  CALCIUM 8.6*  --  8.8* 9.0 9.1 9.6 9.4 9.1  PHOS  --  4.8* 5.3*  --  5.3*  --   --  8.2*   CBC  Recent Labs Lab 09/24/14 0515 09/25/14 0430 09/26/14 0315 10/13/2014 0451  WBC 12.9* 16.6* 25.7* 40.7*  HGB 7.4* 7.6* 8.3* 8.2*  HCT 24.3* 24.9* 26.8* 25.6*  MCV 88.4 87.4 88.7 87.7  PLT 280 346 367 400   Medications:    . antiseptic oral rinse  7 mL Mouth Rinse QID  . aspirin  81 mg Per Tube Daily  . calcitRIOL  0.25 mcg Oral Q M,W,F-1800  . chlorhexidine  15 mL Mouth Rinse BID  . darbepoetin (ARANESP) injection - NON-DIALYSIS  100 mcg Subcutaneous Q Mon-1800  . feeding supplement (OXEPA)  1,000 mL Per Tube Q24H  . feeding supplement (PRO-STAT SUGAR FREE 64)  60 mL Per Tube TID  . ferumoxytol  510 mg Intravenous Weekly  . heparin  5,000 Units Subcutaneous 3 times per day  . hydrALAZINE  25 mg Oral 3 times per day  . insulin aspart  0-20 Units Subcutaneous 6 times per day  . insulin glargine  5 Units Subcutaneous QHS  . labetalol  200 mg  Oral BID  . pantoprazole sodium  40 mg Per Tube Q1200  . cyanocobalamin  100 mcg Oral Daily   Zetta Bills, MD 10-01-14, 7:47 AM

## 2014-10-27 NOTE — Progress Notes (Signed)
CRITICAL VALUE ALERT  Critical value received:  Lactic acid 10.8  Date of notification:  10/05/2014  Time of notification:  1052  Critical value read back:Yes.    Nurse who received alert:  Larina Bras, RN  MD notified (1st page):  Dr. Marchelle Gearing  Time of first page:  1052  MD notified (2nd page):  Time of second page:  Responding MD:  Dr. Marchelle Gearing  Time MD responded:  [phone]

## 2014-10-27 NOTE — Progress Notes (Signed)
  Labs review  Lactate 11 Trop 0.12 Lipase 101 and up LFT - shock liver  A) ? Pancreatitis Worsenign shock - levophed  P) CT abd when goes for CT head Prognosis now grim -   Dr. Kalman Shan, M.D., [facility].C.P Pulmonary and Critical Care Medicine Staff Physician [place] [facility] [place] Pulmonary and Critical Care Pager: [phone], If no answer or between  15:00h - 7:00h: call [phone]  10/17/2014 12:11 PM

## 2014-10-27 NOTE — Progress Notes (Signed)
Dr. Allena Katz approved for patient to receive IV contrast for his CT exam

## 2014-10-27 NOTE — Progress Notes (Signed)
   Patient abruptly went to junctional and as I gave orders for atropine RN reported he went to V Fib and died  I came to bedside and updated wife about stroke, lactic acidosis, shock liver  DEAD  Dr. Kalman Shan, M.D., [facility].C.P Pulmonary and Critical Care Medicine Staff Physician [facility] [place] Pulmonary and Critical Care Pager: [phone], If no answer or between  15:00h - 7:00h: call [phone]  10/17/2014 1:11 PM

## 2014-10-27 NOTE — Progress Notes (Signed)
Patient expired at 1308, absent heart sounds verified by myself, Larina Bras, RN and second RN, Marigene Ehlers.  Dr. Marchelle Gearing present at time of death.  Family at bedside.  CDS called and notified of time of death.  Post-mortem care to follow once family is ready.    Keitha Butte, RN

## 2014-10-27 NOTE — Progress Notes (Signed)
Pt became hypotensive with systolic BP in 60s, notified Dr. Marchelle Gearing.  Orders received for levophed drip.  Family updated per department director's recommendation.

## 2014-10-27 NOTE — Procedures (Signed)
Extubation Procedure Note  Patient Details:   Name: Roger Ashley DOB: [birth year] MRN: [id number]   Airway Documentation:     Evaluation  O2 sats: transiently fell during during procedure Complications: No apparent complications Patient did tolerate procedure well. Bilateral Breath Sounds: Rales Suctioning: Airway No   RT called to patient room to remove ETT per Dr. Marchelle Gearing due to patient passing on ventilator.  Removed without any complications.  Ledell Noss N 10/18/2014, 2:10 PM

## 2014-10-27 NOTE — Progress Notes (Signed)
Attempted SBT on patient this morning, patient ended up having no patient effort and was placed back on full support.  Currently tolerating well.  Will continue to monitor.

## 2014-10-27 NOTE — Progress Notes (Signed)
Inpatient Diabetes Program Recommendations  AACE/ADA: New Consensus Statement on Inpatient Glycemic Control  Target Ranges:  Prepandial:   less than 140 mg/dL      Peak postprandial:   less than 180 mg/dL (1-2 hours)      Critically ill patients:  140 - 180 mg/dL  Pager:  [phone]    Reason for Visit: Low glucose with Tube feeds being held but high when Tube feeds are running.  Inpatient Diabetes Program Recommendations  Insulin - Meal Coverage: Consider adding Novolog 3 units Q4 hr for TF coverage and hold if TF is held.  Alfredia Client PhD, RN, BC-ADM Diabetes Coordinator  Office:  [phone] Team Pager:  [phone]

## 2014-10-27 NOTE — Progress Notes (Signed)
Hypoglycemic Event  CBG: 60  Treatment: D50 IV 50 mL  Symptoms: None  Follow-up CBG: Time:0830 CBG Result:192  Possible Reasons for Event: Unknown  Comments/MD notified:protocol followed    Roger Ashley, Roger Ashley  Remember to initiate Hypoglycemia Order Set & complete

## 2014-10-27 NOTE — Care Management Note (Signed)
Case Management Note  Patient Details  Name: Roger Ashley MRN: [id number] Date of Birth: August 11, 1945  Subjective/Objective:                    Action/Plan:   Expected Discharge Date:                  Expected Discharge Plan:  Home w Home Health Services  In-House Referral:     Discharge planning Services     Post Acute Care Choice:    Choice offered to:     DME Arranged:    DME Agency:     HH Arranged:    HH Agency:     Status of Service:  In process, will continue to follow  Medicare Important Message Given:    Date Medicare IM Given:    Medicare IM give by:    Date Additional Medicare IM Given:    Additional Medicare Important Message give by:     If discussed at Long Length of Stay Meetings, dates discussed:  09/26/2014  Additional Comments:  Yvone Neu, RN 16-Oct-2014, 8:55 AM

## 2014-10-27 NOTE — Progress Notes (Signed)
ANTIBIOTIC CONSULT NOTE - INITIAL  Pharmacy Consult for vancomycin and Zosyn Indication: sepsis  Allergies  Allergen Reactions  . Clonidine Derivatives Other (See Comments)    Hallucinations; nightmares  . Crestor [Rosuvastatin] Other (See Comments)    myalgias    Patient Measurements: Height:  (167.6 cm) Weight: 188 lb 0.8 oz (85.3 kg) IBW/kg (Calculated) : 63.8  Vital Signs: Temp: 94.5 F (34.7 C) (06/01 0700) Temp Source: Rectal (06/01 0700) BP: 90/54 mmHg (06/01 0815) Pulse Rate: 75 (06/01 0730) Intake/Output from previous day: 05/31 0701 - 06/01 0700 In: 405 [NG/GT:405] Out: 1074 [Emesis/NG output:400; Stool:600] Intake/Output from this shift: Total I/O In: 22.9 [I.V.:2.9; NG/GT:20] Out: 2 [Other:2]  Labs:  Recent Labs  09/25/14 0430  09/26/14 0315 09/26/14 1415 2014-10-04 0451  WBC 16.6*  --  25.7*  --  40.7*  HGB 7.6*  --  8.3*  --  8.2*  PLT 346  --  367  --  400  CREATININE 2.98*  < > 3.48* 5.49* 4.21*  < > = values in this interval not displayed. Estimated Creatinine Clearance: 17.7 mL/min (by C-G formula based on Cr of 4.21). No results for input(s): VANCOTROUGH, VANCOPEAK, VANCORANDOM, GENTTROUGH, GENTPEAK, GENTRANDOM, TOBRATROUGH, TOBRAPEAK, TOBRARND, AMIKACINPEAK, AMIKACINTROU, AMIKACIN in the last 72 hours.   Microbiology: Recent Results (from the past 720 hour(s))  MRSA PCR Screening     Status: None   Collection Time: 09/14/2014  4:00 PM  Result Value Ref Range Status   MRSA by PCR NEGATIVE NEGATIVE Final    Comment:        The GeneXpert MRSA Assay (FDA approved for NASAL specimens only), is one component of a comprehensive MRSA colonization surveillance program. It is not intended to diagnose MRSA infection nor to guide or monitor treatment for MRSA infections.   Culture, blood (routine x 2) Call MD if unable to obtain prior to antibiotics being given     Status: None   Collection Time: 09/04/2014  6:15 PM  Result Value Ref Range  Status   Specimen Description BLOOD RIGHT HAND  Final   Special Requests BOTTLES DRAWN AEROBIC AND ANAEROBIC 6CC  Final   Culture NO GROWTH 5 DAYS  Final   Report Status 09/20/2014 FINAL  Final  Culture, blood (routine x 2) Call MD if unable to obtain prior to antibiotics being given     Status: None   Collection Time: 09/12/2014  6:20 PM  Result Value Ref Range Status   Specimen Description BLOOD RIGHT ARM  Final   Special Requests BOTTLES DRAWN AEROBIC AND ANAEROBIC 6CC  Final   Culture NO GROWTH 5 DAYS  Final   Report Status 09/20/2014 FINAL  Final  Clostridium Difficile by PCR     Status: None   Collection Time: 09/25/14 10:44 PM  Result Value Ref Range Status   C difficile by pcr NEGATIVE NEGATIVE Final  Culture, blood (routine x 2)     Status: None (Preliminary result)   Collection Time: 09/26/14 12:10 PM  Result Value Ref Range Status   Specimen Description BLOOD RIGHT WRIST  Final   Special Requests BOTTLES DRAWN AEROBIC AND ANAEROBIC 10CC EACH  Final   Culture   Final           BLOOD CULTURE RECEIVED NO GROWTH TO DATE CULTURE WILL BE HELD FOR 5 DAYS BEFORE ISSUING A FINAL NEGATIVE REPORT Performed at [facility]    Report Status PENDING  Incomplete  Culture, blood (routine x 2)  Status: None (Preliminary result)   Collection Time: 09/26/14 12:15 PM  Result Value Ref Range Status   Specimen Description BLOOD BLOOD RIGHT FOREARM  Final   Special Requests BOTTLES DRAWN AEROBIC AND ANAEROBIC 10CC EACH  Final   Culture   Final           BLOOD CULTURE RECEIVED NO GROWTH TO DATE CULTURE WILL BE HELD FOR 5 DAYS BEFORE ISSUING A FINAL NEGATIVE REPORT Performed at [facility]    Report Status PENDING  Incomplete    Medical History: Past Medical History  Diagnosis Date  . Nonischemic cardiomyopathy 2002    LVEF 15-20% initially; EF recovred to 50% by Echo in 03/2002; MUGA at [facility] EF 53%  . Diabetes mellitus type II   . Hyperlipidemia   . Essential  hypertension   . 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 Erectile dysfunction   . Hearing impairment   . Anemia   . Acute respiratory failure   . CAP (community acquired pneumonia)   . Sepsis     2016    Assessment: 23 YOM originally admitted to [facility] with SOB and was started on abx for CAP. His breathing status worsened and he required intubation and transfer to [facility]. Has had worsening renal function and has required CRRT (note he is being followed by the [facility] for preparation for HD- had a fistula placed ~1 month ago). Over the past 2 days his WBC increased dramatically. He was re-cultured yesterday, and currently there is no growth. He is currently hypothermic with WBC of 40.7. He remains on CRRT this morning.  Goal of Therapy:  Vancomycin trough level 15-20 mcg/ml  Plan:  -vancomycin loading dose with 1500mg  IV x1, then 1000mg  IV q24h as maintenance dose.  -Zosyn 3.375g IV q6h -follow c/s, repeat LA and PCT, CRRT tolerance, trough soon  Kennie Karapetian D. Legrande Hao, PharmD, BCPS Clinical Pharmacist Pager: [phone] Oct 23, 2014 8:39 AM

## 2014-10-27 NOTE — Progress Notes (Signed)
STROKE TEAM PROGRESS NOTE   HISTORY Roger Ashley is an 69 y.o. male with cardiomyopathy, chronic kidney disease stage IV, diabetes mellitus and hypertension who was transferred from [facility] on September 30, 2014 for management of worsening of shortness of breath with respiratory failure. Patient was transferred to [facility] following intubation. Patient has remained intubated and ventilator dependent. He reportedly was responsive yesterday including to voice, as well as following simple commands. Overnight he has become unresponsive. He has not been febrile. His WBC count today was 25.7, up from 16.6 yesterday. BUN was 67 and creatinine was 3.48, up from 2.82 yesterday. CT scan of his head was obtained today which showed is consistent with acute left occipital ischemic infarction. Time of onset is unclear.  LSN: Unclear; patient was more responsive on 09/25/2014. tPA Given: No: Critically ill and unstable, as well as unclear time of onset of acute stroke area   SUBJECTIVE (INTERVAL HISTORY) His family is at the bedside.  Wife is present, tearful at his ongoing decline. Concerned why he is not waking up. Asking appropriate questions.   OBJECTIVE Temp:  [94.5 F (34.7 C)-98.7 F (37.1 C)] 94.5 F (34.7 C) (06/01 0700) Pulse Rate:  [51-95] 75 (06/01 0730) Cardiac Rhythm:  [-] Normal sinus rhythm (06/01 0400) Resp:  [17-32] 23 (06/01 0815) BP: (59-132)/(32-100) 90/54 mmHg (06/01 0815) SpO2:  [90 %-100 %] 100 % (06/01 0730) FiO2 (%):  [30 %-40 %] 30 % (06/01 0818) Weight:  [85.3 kg (188 lb 0.8 oz)] 85.3 kg (188 lb 0.8 oz) (06/01 0500)   Recent Labs Lab 09/26/14 1536 09/26/14 1940 10/01/2014 0049 10/08/2014 0317 10/17/2014 0609  GLUCAP 251* 274* 177* 132* 74    Recent Labs Lab 09/24/14 0524 09/24/14 1721 09/25/14 0430 09/25/14 1925 09/26/14 0315 09/26/14 1415 10/06/2014 0451  NA  --  136 137 134* 137 138 139  K  --  4.6 4.7 4.5 4.7 5.2* 4.5  CL  --  101 100* 98* 97* 99* 99*  CO2  --   21* 16* 15*  GLUCOSE  --  181* 153* 283* 208* 282* 85  BUN  --  67* 59* 58* 67* 103* 77*  CREATININE  --  3.42* 2.98* 2.82* 3.48* 5.49* 4.21*  CALCIUM  --  8.8* 9.0 9.1 9.6 9.4 9.1  MG 2.8*  --   --   --   --   --  3.3*  PHOS 4.8* 5.3*  --  5.3*  --   --  8.2*    Recent Labs Lab 09/24/14 1721 09/25/14 1925  ALBUMIN 2.5* 2.8*    Recent Labs Lab 09/23/14 0430 09/24/14 0515 09/25/14 0430 09/26/14 0315 10/25/2014 0451  WBC 11.6* 12.9* 16.6* 25.7* 40.7*  HGB 7.2* 7.4* 7.6* 8.3* 8.2*  HCT 23.5* 24.3* 24.9* 26.8* 25.6*  MCV 89.0 88.4 87.4 88.7 87.7  PLT 233 280 346 367 400   No results for input(s): CKTOTAL, CKMB, CKMBINDEX, TROPONINI in the last 168 hours. No results for input(s): LABPROT, INR in the last 72 hours. No results for input(s): COLORURINE, LABSPEC, PHURINE, GLUCOSEU, HGBUR, BILIRUBINUR, KETONESUR, PROTEINUR, UROBILINOGEN, NITRITE, LEUKOCYTESUR in the last 72 hours.  Invalid input(s): APPERANCEUR     Component Value Date/Time   CHOL 169 07/26/2012 0737   TRIG 293* 09/24/2014 0957   HDL 32* 07/26/2012 0737   CHOLHDL 5.3 07/26/2012 0737   VLDL 17 07/26/2012 0737   LDLCALC 120* 07/26/2012 0737   Lab Results  Component Value Date   HGBA1C 5.4  2014/10/05   No results found for: LABOPIA, COCAINSCRNUR, LABBENZ, AMPHETMU, THCU, LABBARB  No results for input(s): ETH in the last 168 hours.  Ct Head Wo Contrast 09/26/2014    Left occipital lobe nonhemorrhagic infarct suspected. 09/24/2014 Motion degraded images. No evidence of acute intracranial abnormality. Atrophy with small vessel ischemic changes.  2D Echocardiogram   - Left ventricle: Wall thickness was increased in a pattern ofmoderate LVH. Systolic function was mildly to moderately reduced.The estimated ejection fraction was in the range of 40% to 45%.Diffuse hypokinesis. Features are consistent with a pseudonormal left ventricular filling pattern, with concomitant abnormalrelaxation and increased  filling pressure (grade 2 diastolicdysfunction). - Aortic valve: Mildly calcified annulus. Trileaflet; mildlythickened leaflets. There was trivial regurgitation. Valve area(VTI): 2.29 cm^2. Valve area (Vmax): 2.01 cm^2. Valve area(Vmean): 2.02 cm^2. - Mitral valve: Mildly calcified annulus. Mildly thickened leaflets. - Left atrium: The atrium was severely dilated. - Systemic veins: The IVC is dilated with normal respiratorycollapse, estimated RA pressure is 8 mmHg. - Technically adequate study.  EEG indicative of a severe but no specific encephalopathy. No electrographic seizures noted.  PHYSICAL EXAM Middle aged male intubated. Not sedated at present.On Continuous dialysis machine. . Afebrile. Head is nontraumatic. Neck is supple without bruit.    Cardiac exam no murmur or gallop. Lungs are clear to auscultation. Distal pulses are well felt. Neurological Exam : Up to date. Barely opens eyes to sternal rub. Pupils 4 mm sluggishly reactive. Right corneal reflex is absent and left is sluggish. Fundi were not visualized. Extraocular movements are absent. Dolls eye movements are sluggish. Face is symmetric. Tongue is midline. Weak cough and gag. Breathes slightly above the ventilator settings No spontaneous extremity movements noted. Right hemiplegia with diminished on the right leg and arm. Minimum withdrawal in the left lower extremity to pain. No withdrawal in bilateral upper extremities to pain. Plantars are both not elicitable.  ASSESSMENT/PLAN [name] is a 69 y.o. male with history of cardiomyopathy, chronic kidney disease stage IV, diabetes mellitus and hypertension who was transferred to [facility]  10-05-2014 for management of respiratory failure, intubated. 5/30 was noted to be increasingly unresponsive. CT shows R occipital infarct that has developed since admission.  He did not receive IV t-PA due to incidental finding of stroke on CT.   In-Hospital Stroke:  New right occipital  infarct seen on CT. Concerned for basilar occlusion and brain stem strokes not seen on CT given neuro exam. Suspect large posterior circulation are multiple infarcts  Resultant  R > L hemiparesis, Absent rt corneal,  , comatose state, weak cough/gag  CT R occipital infarct  CTA head and neck ordered   2D Echo  EF 40-45%, no source of embolus seen  EEG no seizure  LDL ordered  HgbA1c 5.4  SCDs, Heparin 5000 units sq tid for VTE prophylaxis  aspirin 81 mg orally every day prior to admission, now on aspirin 81 mg orally every day  Therapy recommendations:  pending   Disposition:  pending   Hypotension  as slow as 76/55   Hx Essential Hypertension  Hyperlipidemia  Home meds:  No statin  LDL ordered, goal < 70  Allergic to crestor  Diabetes type II  HgbA1c 5.4, at goal < 7.0  Other Stroke Risk Factors  Advanced age  Former Cigarette smoker, quit smoking 32 years ago   Obesity, Body mass index is 30.37 kg/(m^2).   Hx stroke/TIA  Hx Nonischemic cardiomyopathy, EF 15-20%. Listed as Chronic Systolic HF  Other Active  Problems  Acute Hypoxemic Respiratory Failure. 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 I have made any additions or clarifications directly to the above note.  This patient has altered mental status for the last 2 days with likely due to posterior circulation infarcts. Given the CT scan shows only left occipital infarct his neurological exam suggests brainstem and larger infarcts which need to be confirmed. He remains at risk for neurological worsening, recurrent strokes and needs ongoing stroke  evaluation. Patient is too unstable to get an MRI at the present time hence we will obtain CT angiogram of the brain and neck. I had a long discussion with the patient's wife at the bedside and answered questions. I suspect his prognosis is very guarded given his significant medical comorbidities and poor neurological exam. Discussed with Dr. Marchelle Gearing This patient is critically ill and at significant risk of neurological worsening, death and care requires constant monitoring of vital signs, hemodynamics,respiratory and cardiac monitoring,review of multiple databases, neurological assessment, discussion with family, other specialists and medical decision making of high complexity.I have made any additions or clarifications directly to the above note.  I spent 40 minutes of neurocritical care time  in the care of  this patient.  Delia Heady, MD Medical Director [facility] Pager: [phone] 2014/10/23 2:25 PM   To contact Stroke Continuity provider, please refer to [email or web address]. After hours, contact General Neurology

## 2014-10-27 DEATH — deceased

## 2016-08-24 IMAGING — CR DG CHEST 1V PORT
1 series · 1 of 1 positions shown · non-contrast
Comparison: None.

CLINICAL DATA: Progressive shortness of breath. Nonproductive
cough.

EXAM:
PORTABLE CHEST - 1 VIEW

[ap portable]
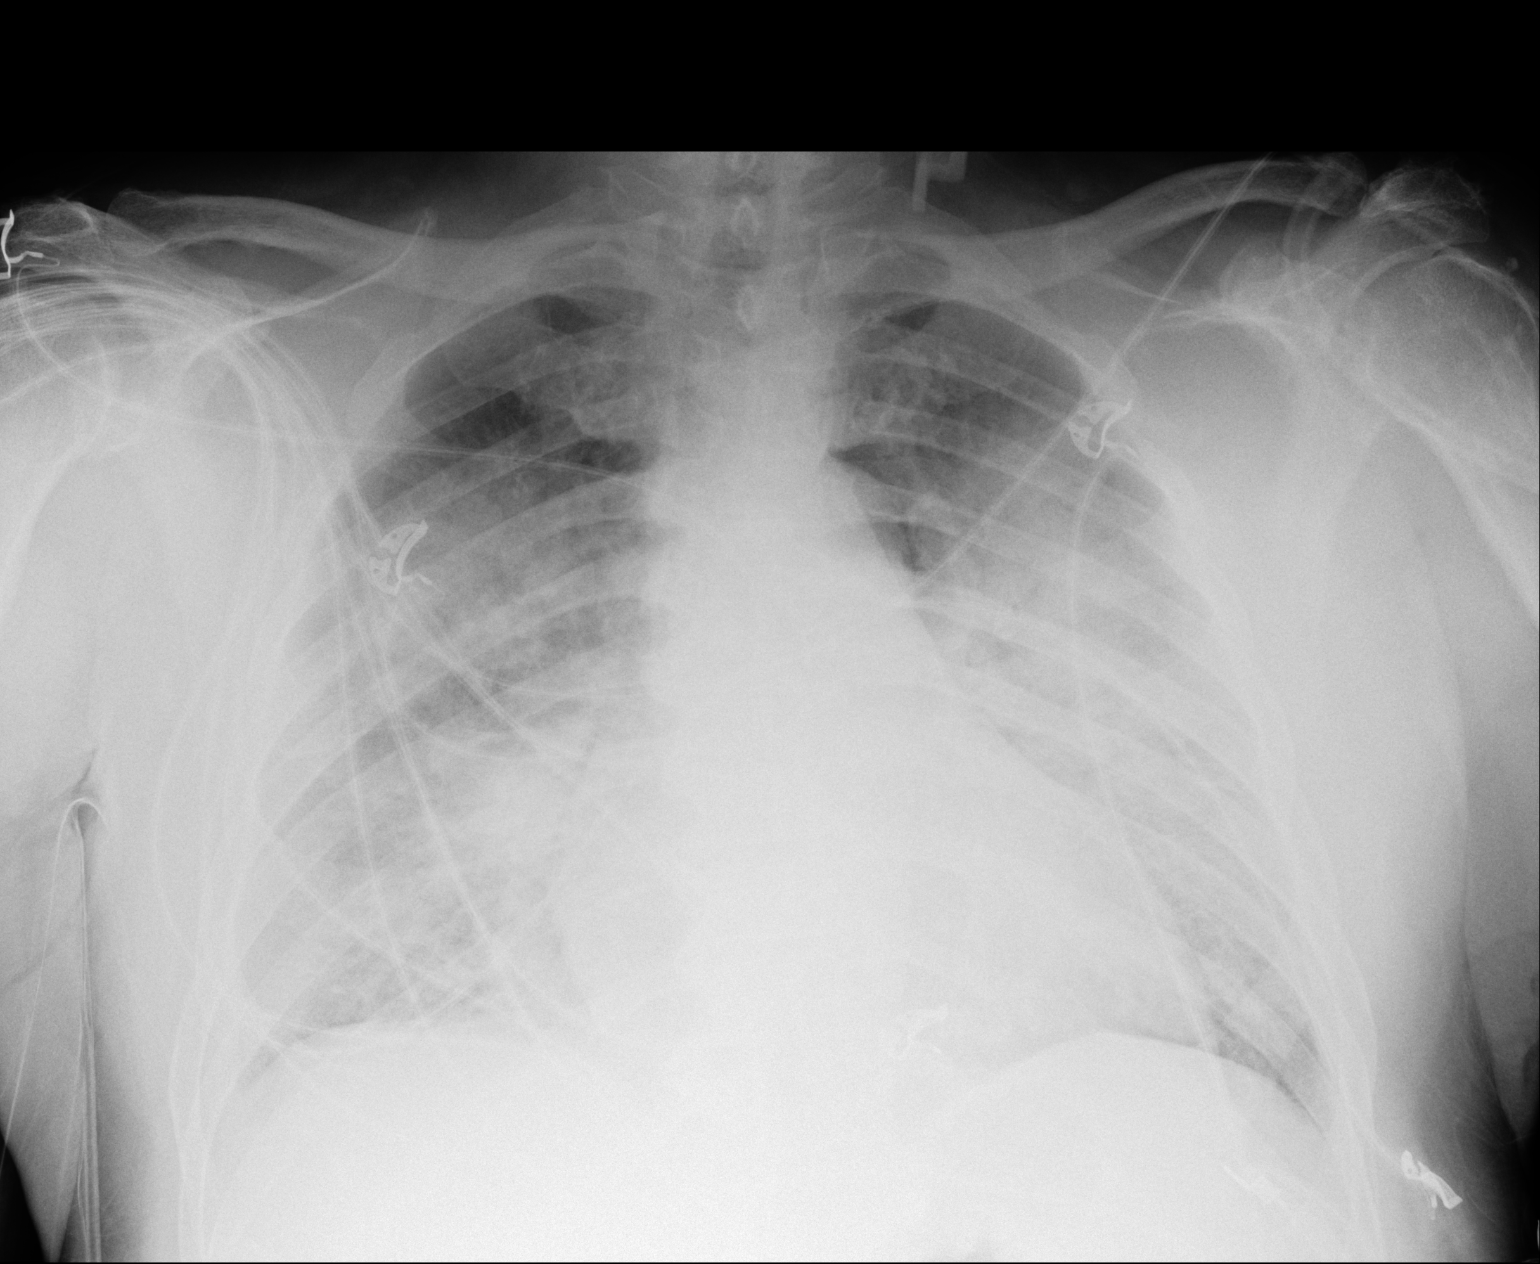

[1 of 1 positions shown; findings below may reference images not displayed]

FINDINGS: The patient has extensive bilateral consolidative pulmonary
infiltrates. Heart size and pulmonary vascularity are normal. No
effusions. No acute osseous abnormality.
IMPRESSION: Extensive bilateral pulmonary infiltrates.

## 2016-08-26 IMAGING — CR DG ABD PORTABLE 1V
1 series · 1 of 1 positions shown · non-contrast
Comparison: None.

CLINICAL DATA: OG tube placement

EXAM:
PORTABLE ABDOMEN - 1 VIEW

[AP]
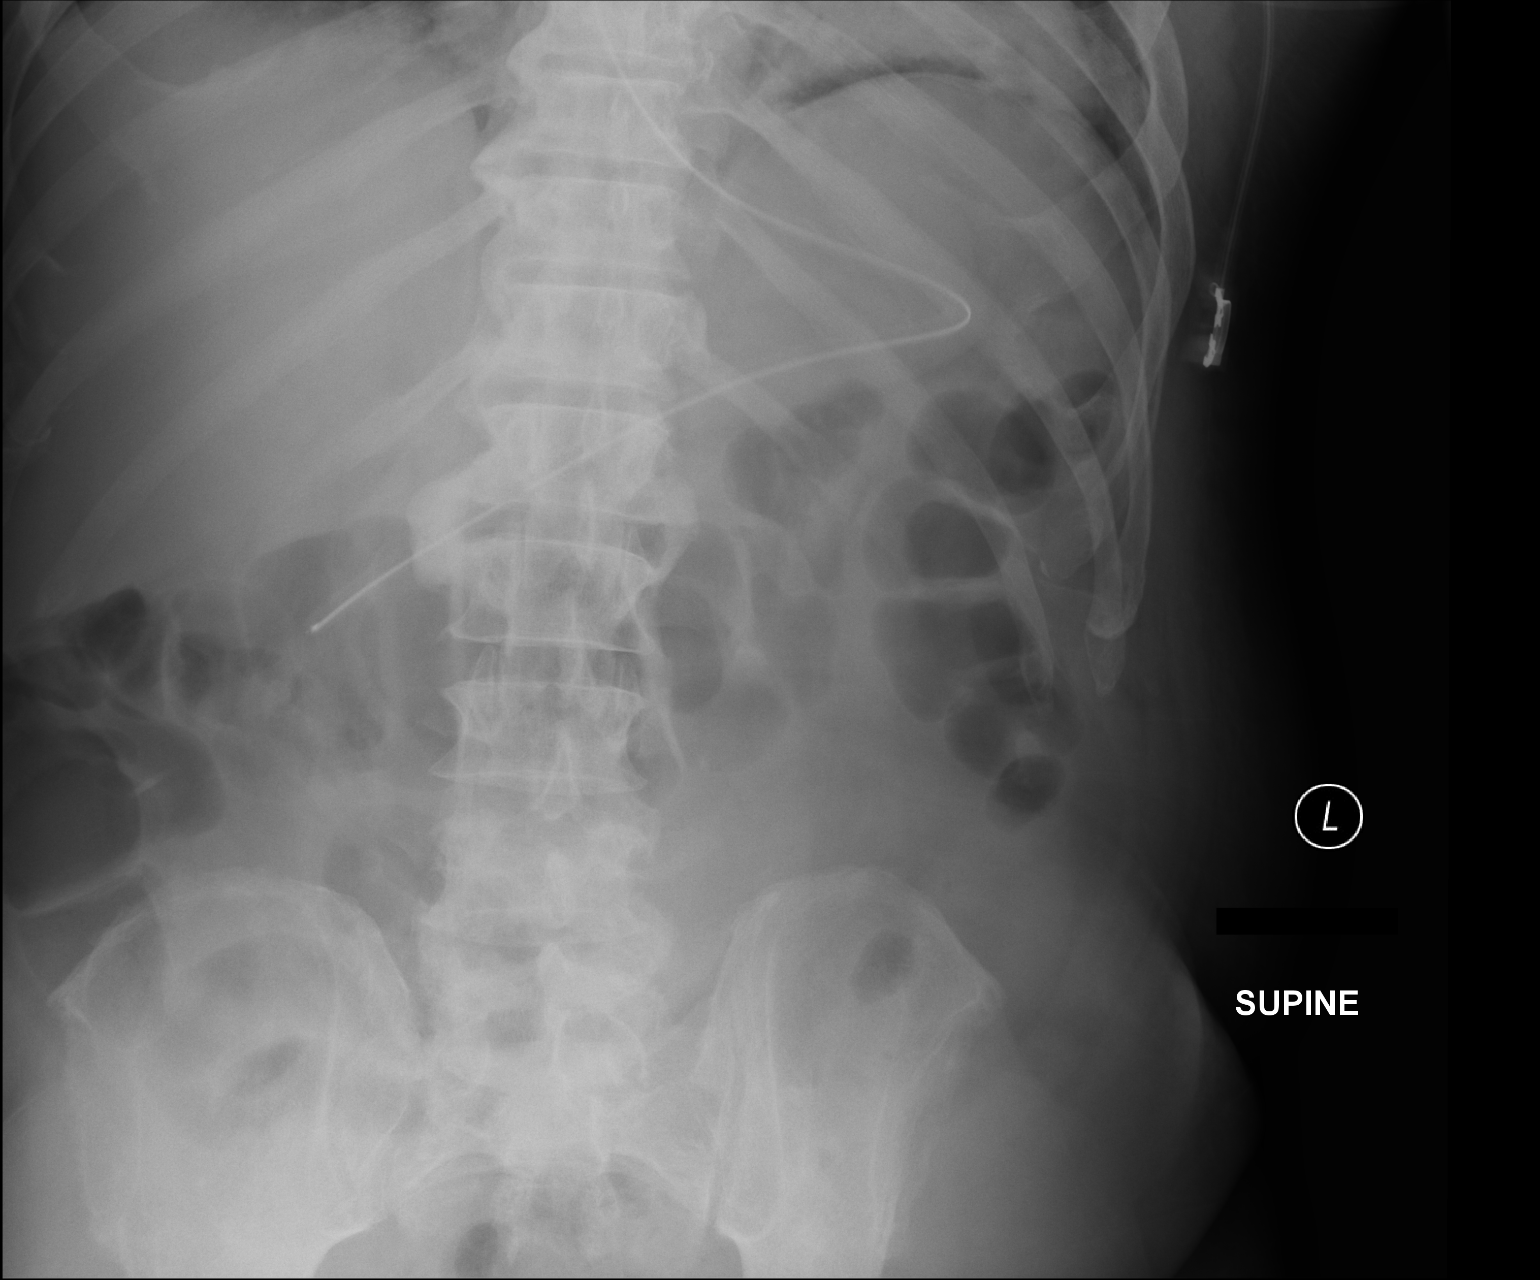

[1 of 1 positions shown; findings below may reference images not displayed]

FINDINGS: Enteric tube terminates in the region of the distal gastric
antrum/proximal duodenum.

Nonobstructive bowel gas pattern.
IMPRESSION: Enteric tube terminates in the region of the distal gastric
antrum/proximal duodenum.

## 2016-08-27 IMAGING — US US RENAL
1 series · 14 of 21 positions shown · non-contrast
Comparison: None.

CLINICAL DATA: Acute kidney injury.  Initial encounter.

EXAM:
RENAL / URINARY TRACT ULTRASOUND COMPLETE

[Series 1: us renal · 0.22mm/px · 14 of 21 slices shown]
[im 1/21]
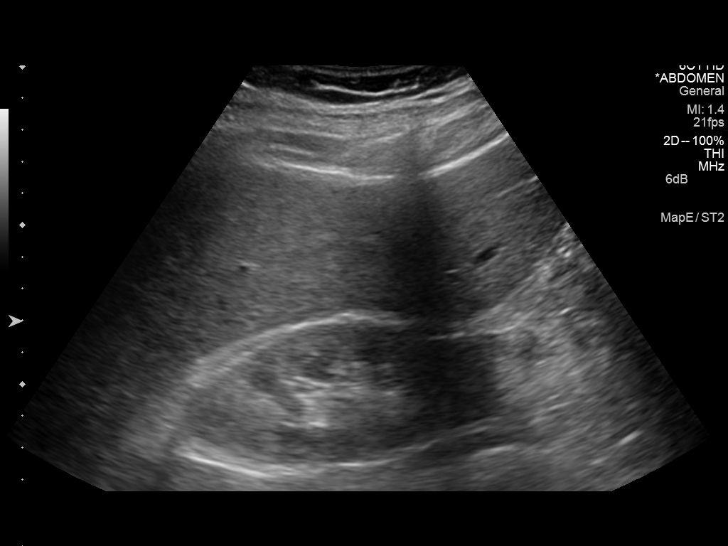
[im 3/21]
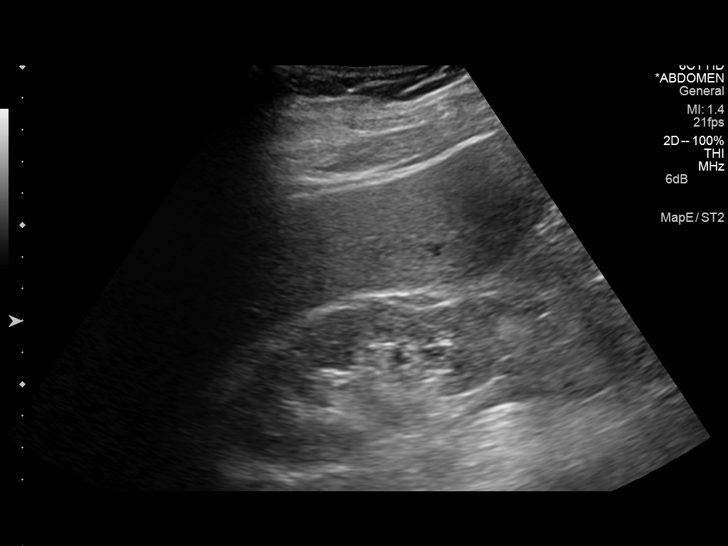
[im 4/21]
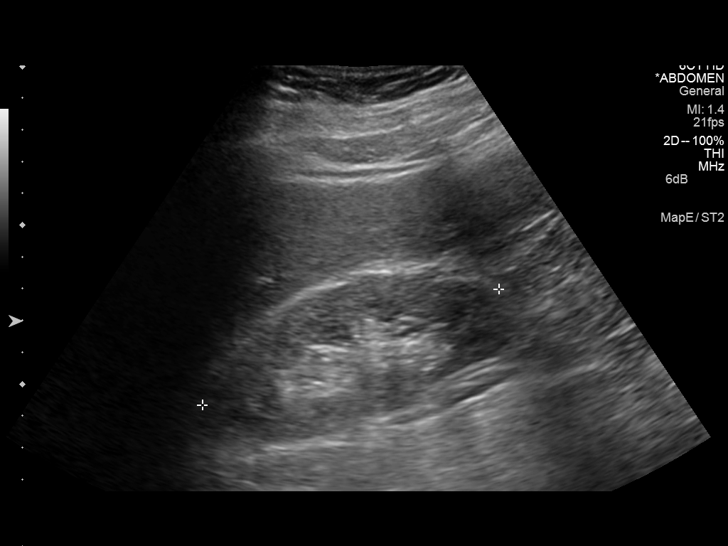
[im 6/21]
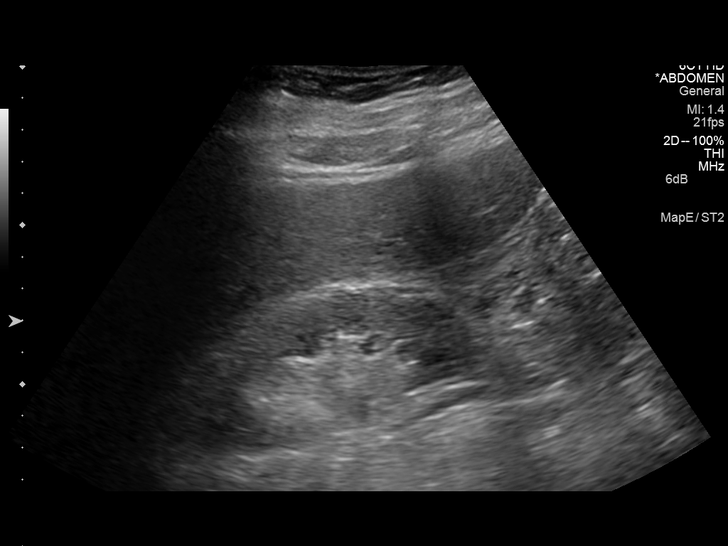
[im 7/21]
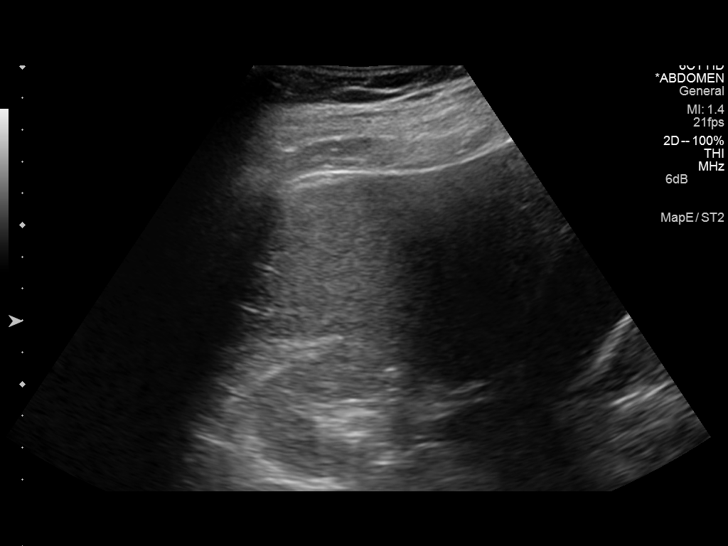
[im 9/21]
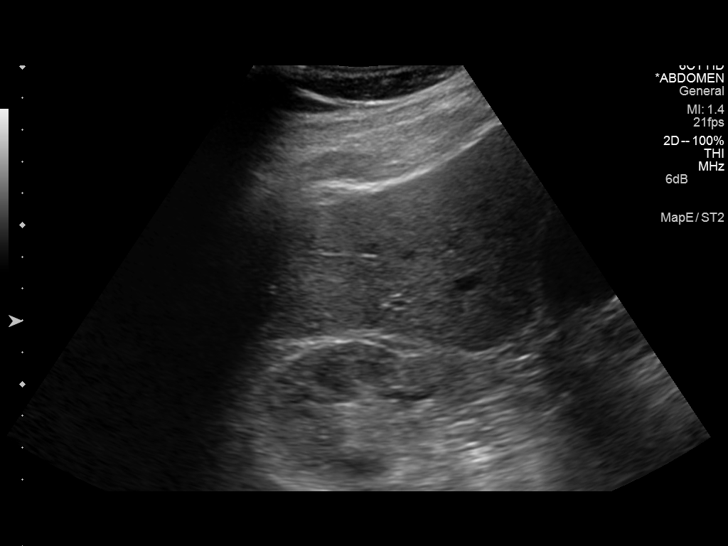
[im 10/21]
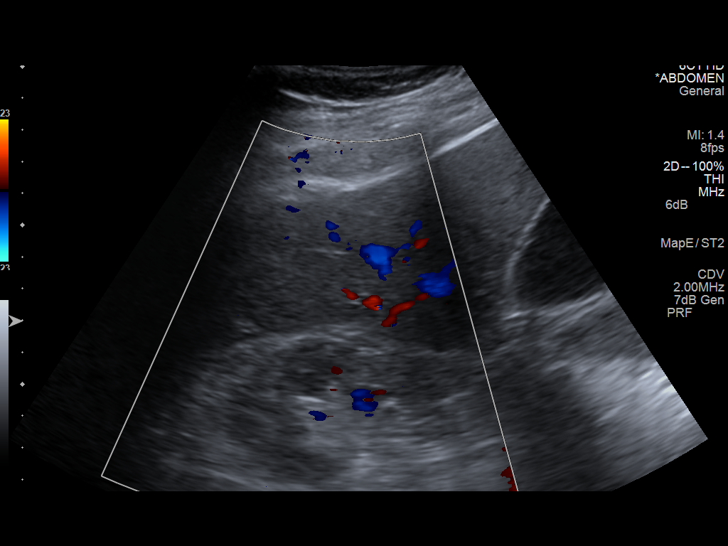
[im 12/21]
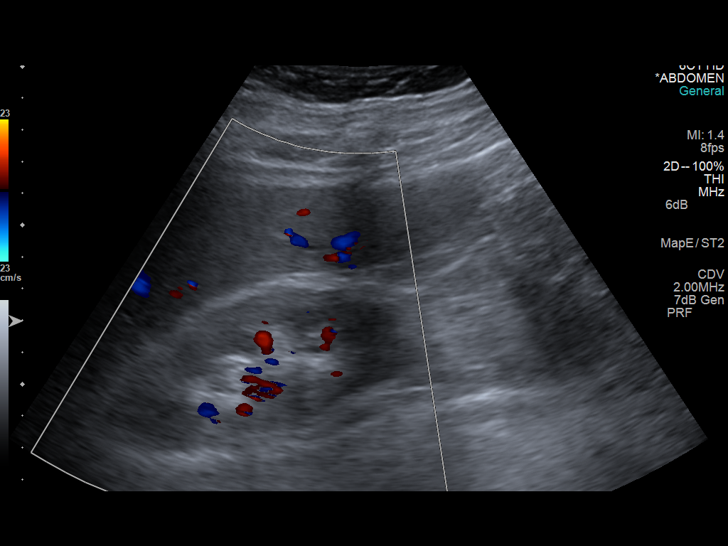
[im 13/21]
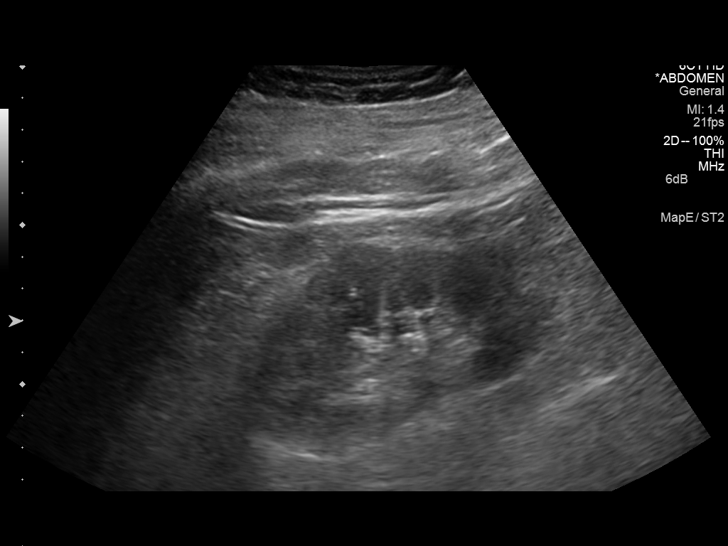
[im 15/21]
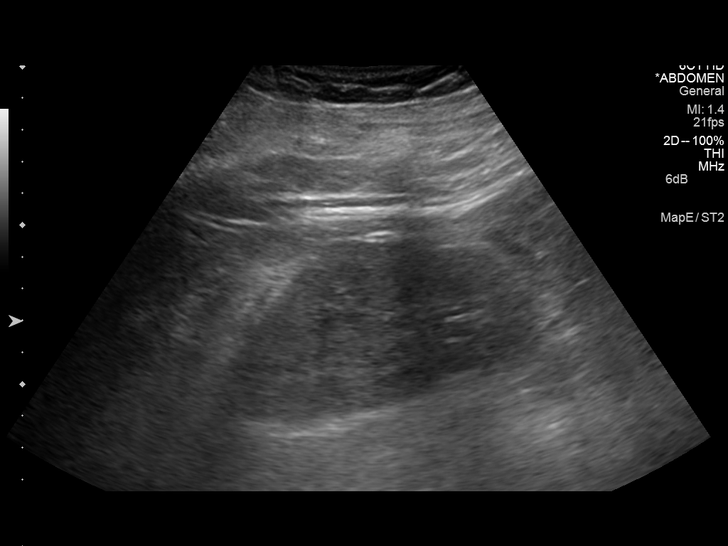
[im 16/21]
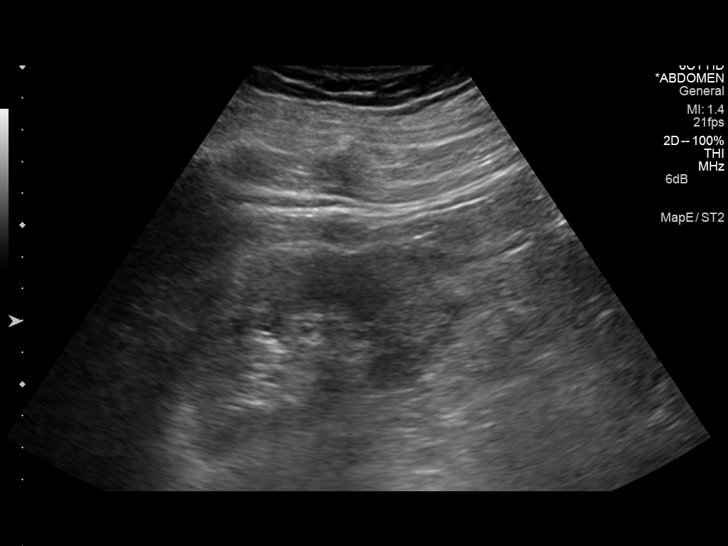
[im 18/21]
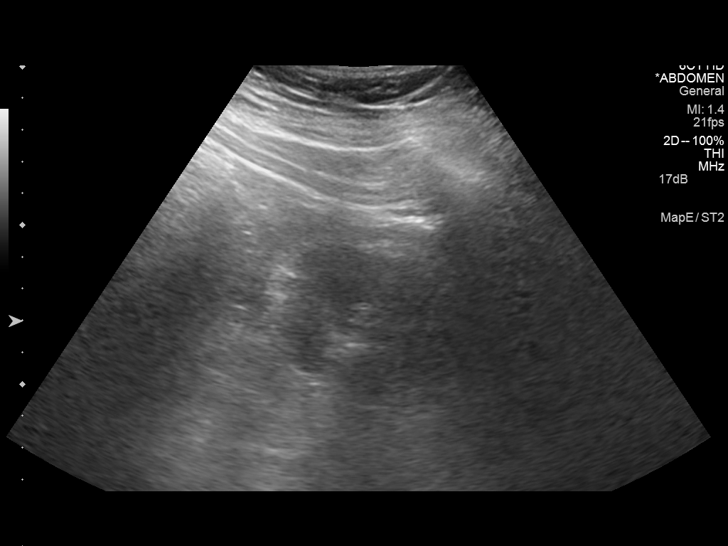
[im 19/21]
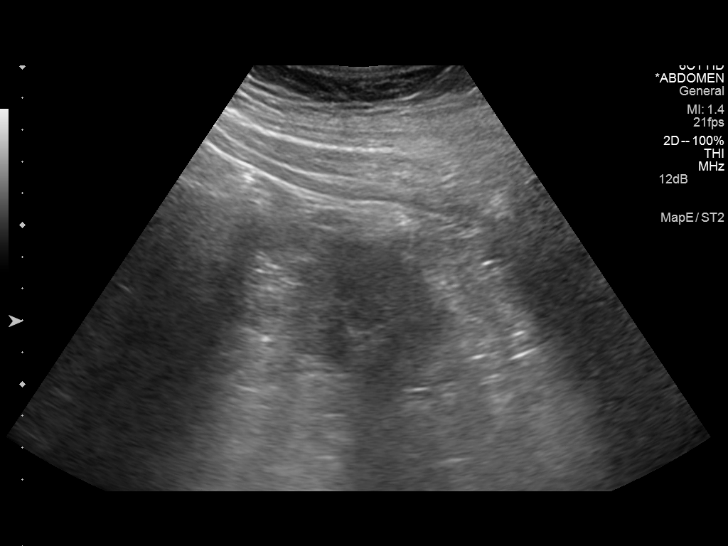
[im 21/21]
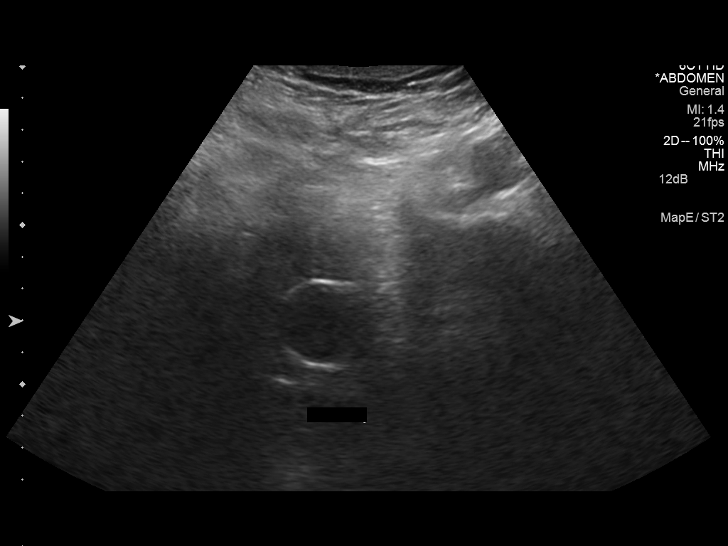

[14 of 21 positions shown; findings below may reference images not displayed]

FINDINGS: Study was performed portably in the ICU.

Right Kidney:

Length: 10.0 cm. There is renal cortical thinning and increased
echogenicity. No hydronephrosis or focal cortical lesion identified.

Left Kidney:

Length: 10.3 cm. There is renal cortical thinning and increased
echogenicity. No hydronephrosis or focal cortical lesion identified.

Bladder:

Decompressed by Foley catheter.
IMPRESSION: 1. Both kidneys demonstrate cortical thinning and increased
echogenicity consistent with chronic medical renal disease.
2. No evidence of hydronephrosis.  Decompressed bladder.

## 2016-08-27 IMAGING — CR DG CHEST 1V PORT
1 series · 1 of 1 positions shown · non-contrast
Comparison: 09/17/2014.

CLINICAL DATA: Respiratory failure.  Shortness of breath.

EXAM:
PORTABLE CHEST - 1 VIEW

[AP]
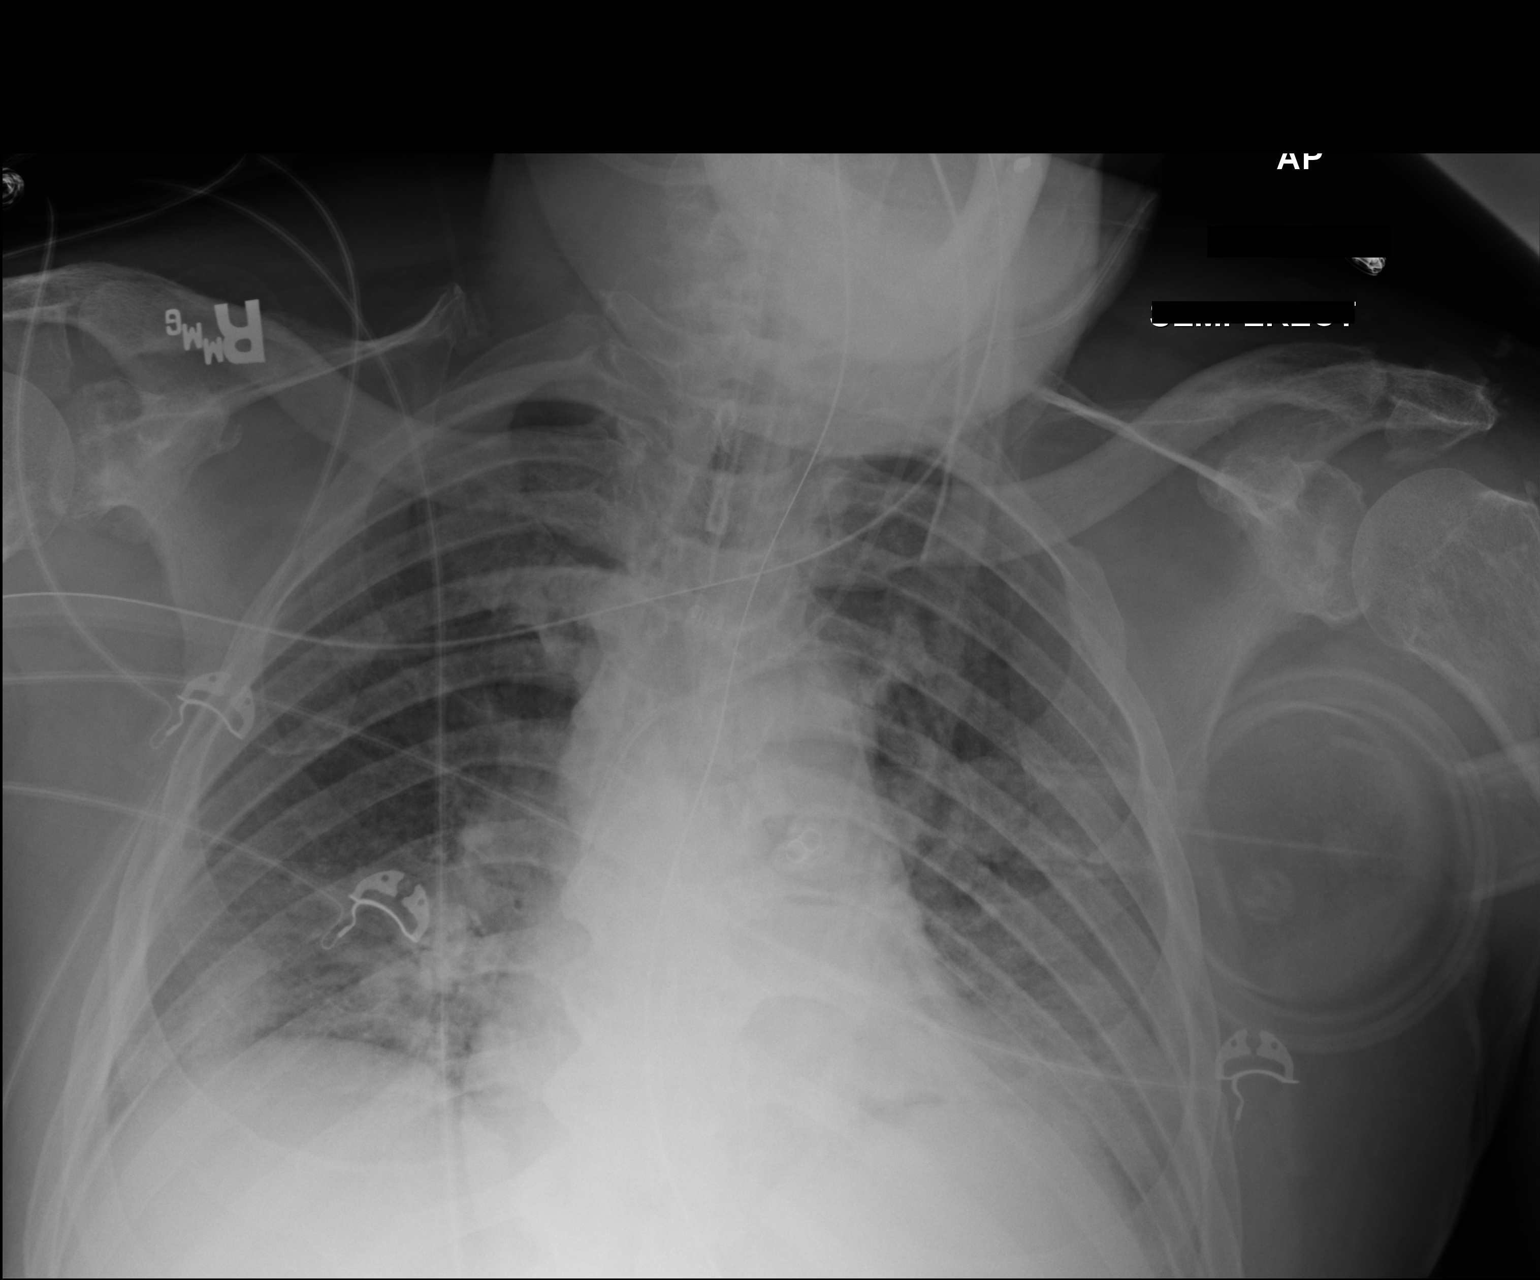

[1 of 1 positions shown; findings below may reference images not displayed]

FINDINGS: Endotracheal tube, NG tube, left IJ line in stable position.
Cardiomegaly with bilateral pulmonary alveolar infiltrates,
increased in the lung bases. Low lung volumes with basilar
atelectasis. Small left pleural effusion cannot be excluded. No
pneumothorax.
IMPRESSION: 1. Lines and tubes in stable position.
2. Persistent cardiomegaly and bilateral pulmonary infiltrates
suggesting congestive heart failure. Bilateral pneumonia cannot be
excluded. Low lung volumes with basilar atelectasis.

## 2016-08-29 IMAGING — CR DG CHEST 1V PORT
1 series · 1 of 1 positions shown · non-contrast
Comparison: 09/19/2014.

CLINICAL DATA: Shortness of breath, pneumonia.

EXAM:
PORTABLE CHEST - 1 VIEW

[AP]
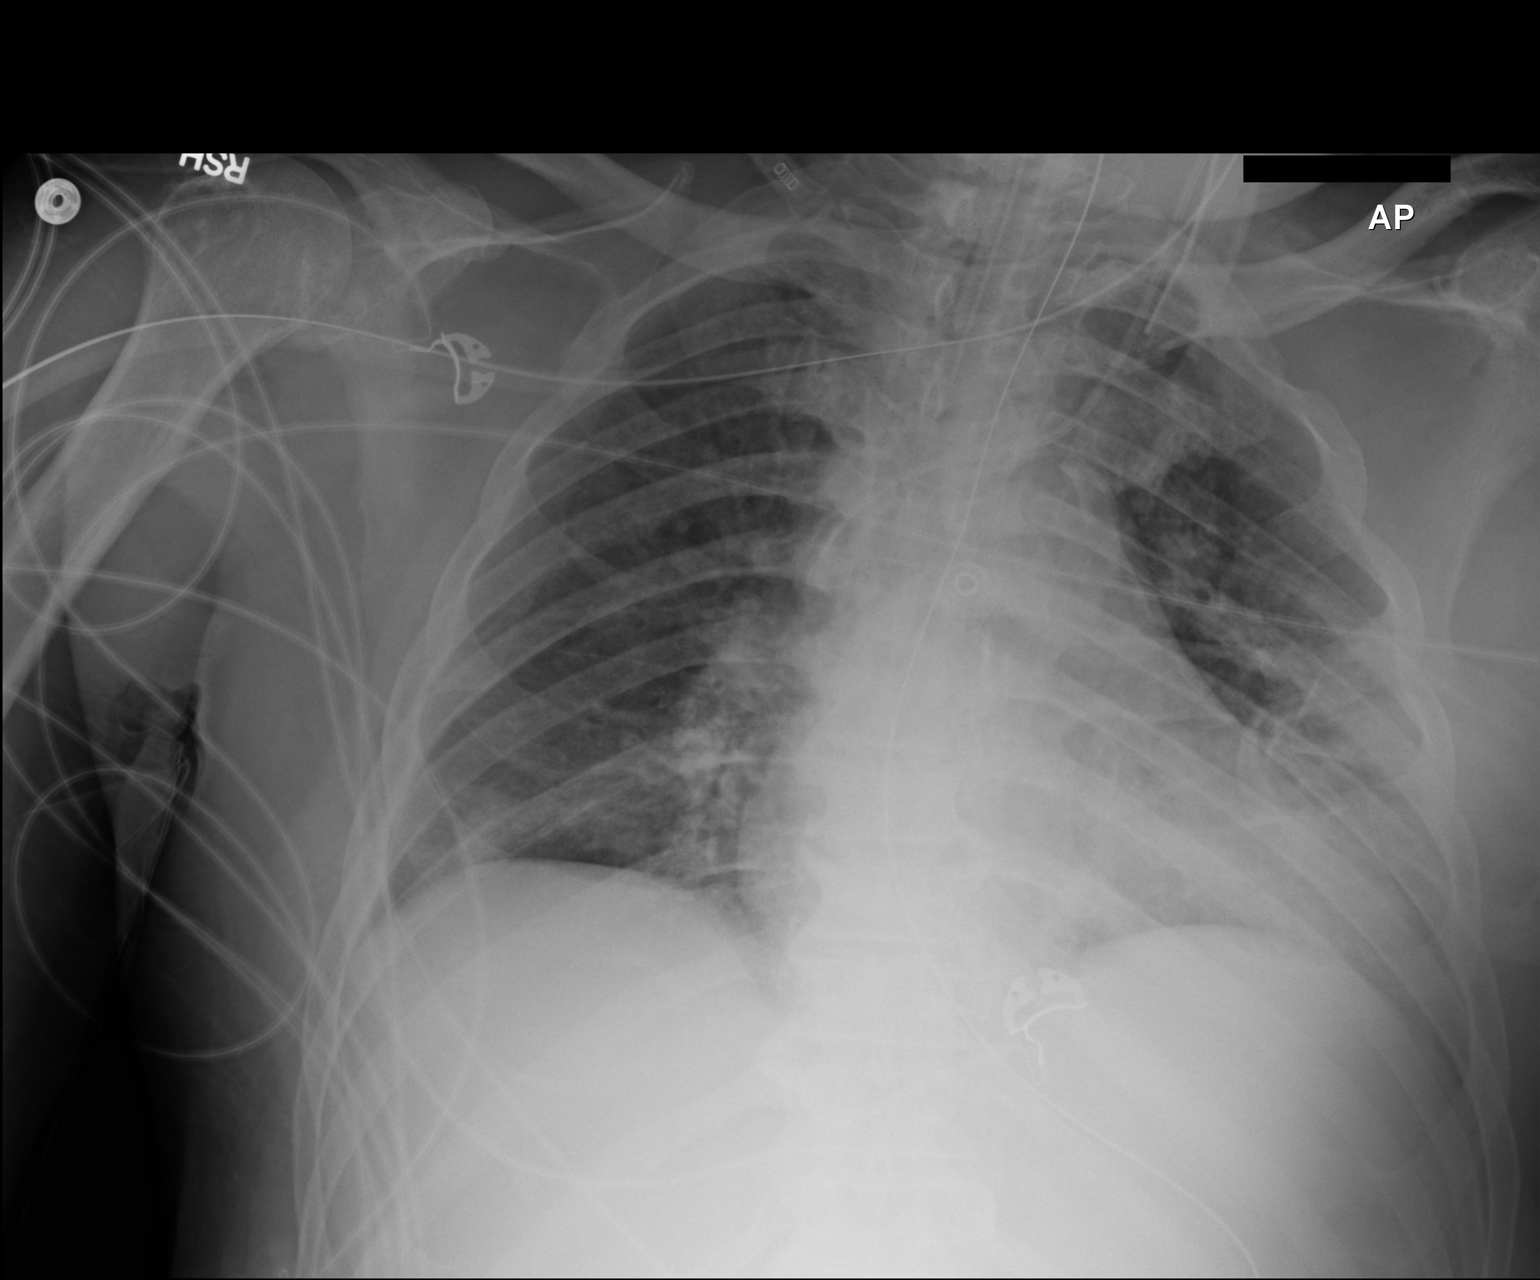

[1 of 1 positions shown; findings below may reference images not displayed]

FINDINGS: Endotracheal tube terminates 4.3 cm above the carina. Nasogastric
tube is followed into the stomach. Left IJ central line tip projects
over the SVC. Heart size stable. Patchy bibasilar airspace
opacification persists, left greater than right. No definite pleural
fluid.
IMPRESSION: Patchy bibasilar airspace opacification persists, left greater than
right, suspicious for pneumonia.
